# Patient Record
Sex: Male | Born: 1955 | Race: White | Hispanic: No | Marital: Single | Smoking: Former smoker
Health system: Southern US, Community
[De-identification: ages and names within clinical notes are randomized; demographics above are authoritative.]

## PROBLEM LIST (undated history)

## (undated) DIAGNOSIS — M1990 Unspecified osteoarthritis, unspecified site: Secondary | ICD-10-CM

## (undated) DIAGNOSIS — K5903 Drug induced constipation: Secondary | ICD-10-CM

## (undated) DIAGNOSIS — F329 Major depressive disorder, single episode, unspecified: Secondary | ICD-10-CM

## (undated) DIAGNOSIS — I10 Essential (primary) hypertension: Secondary | ICD-10-CM

## (undated) DIAGNOSIS — I2510 Atherosclerotic heart disease of native coronary artery without angina pectoris: Secondary | ICD-10-CM

## (undated) DIAGNOSIS — I219 Acute myocardial infarction, unspecified: Secondary | ICD-10-CM

## (undated) DIAGNOSIS — E782 Mixed hyperlipidemia: Secondary | ICD-10-CM

## (undated) DIAGNOSIS — R32 Unspecified urinary incontinence: Secondary | ICD-10-CM

## (undated) DIAGNOSIS — C801 Malignant (primary) neoplasm, unspecified: Secondary | ICD-10-CM

## (undated) DIAGNOSIS — L719 Rosacea, unspecified: Secondary | ICD-10-CM

## (undated) DIAGNOSIS — F32A Depression, unspecified: Secondary | ICD-10-CM

## (undated) HISTORY — PX: TONSILLECTOMY: SUR1361

## (undated) HISTORY — DX: Unspecified osteoarthritis, unspecified site: M19.90

## (undated) HISTORY — PX: PROSTATE SURGERY: SHX751

## (undated) HISTORY — PX: CORONARY ANGIOPLASTY WITH STENT PLACEMENT: SHX49

## (undated) HISTORY — DX: Mixed hyperlipidemia: E78.2

## (undated) HISTORY — DX: Essential (primary) hypertension: I10

## (undated) HISTORY — DX: Depression, unspecified: F32.A

## (undated) HISTORY — DX: Major depressive disorder, single episode, unspecified: F32.9

## (undated) HISTORY — DX: Acute myocardial infarction, unspecified: I21.9

---

## 1996-09-26 DIAGNOSIS — I219 Acute myocardial infarction, unspecified: Secondary | ICD-10-CM

## 1996-09-26 HISTORY — DX: Acute myocardial infarction, unspecified: I21.9

## 2000-02-03 ENCOUNTER — Ambulatory Visit (HOSPITAL_BASED_OUTPATIENT_CLINIC_OR_DEPARTMENT_OTHER): Admission: RE | Admit: 2000-02-03 | Discharge: 2000-02-03 | Payer: Self-pay | Admitting: Orthopedic Surgery

## 2000-02-03 ENCOUNTER — Encounter (INDEPENDENT_AMBULATORY_CARE_PROVIDER_SITE_OTHER): Payer: Self-pay | Admitting: *Deleted

## 2007-10-24 ENCOUNTER — Encounter: Admission: RE | Admit: 2007-10-24 | Discharge: 2007-10-24 | Payer: Self-pay | Admitting: Family Medicine

## 2008-05-10 ENCOUNTER — Emergency Department (HOSPITAL_COMMUNITY): Admission: EM | Admit: 2008-05-10 | Discharge: 2008-05-10 | Payer: Self-pay | Admitting: *Deleted

## 2008-07-27 HISTORY — PX: BACK SURGERY: SHX140

## 2008-08-06 ENCOUNTER — Encounter (INDEPENDENT_AMBULATORY_CARE_PROVIDER_SITE_OTHER): Payer: Self-pay | Admitting: Orthopedic Surgery

## 2008-08-07 ENCOUNTER — Inpatient Hospital Stay (HOSPITAL_COMMUNITY): Admission: RE | Admit: 2008-08-07 | Discharge: 2008-08-08 | Payer: Self-pay | Admitting: Orthopedic Surgery

## 2008-10-13 ENCOUNTER — Encounter (INDEPENDENT_AMBULATORY_CARE_PROVIDER_SITE_OTHER): Payer: Self-pay | Admitting: Urology

## 2008-10-13 ENCOUNTER — Inpatient Hospital Stay (HOSPITAL_COMMUNITY): Admission: RE | Admit: 2008-10-13 | Discharge: 2008-10-14 | Payer: Self-pay | Admitting: Urology

## 2009-03-26 HISTORY — PX: ELBOW SURGERY: SHX618

## 2009-12-09 ENCOUNTER — Encounter: Admission: RE | Admit: 2009-12-09 | Discharge: 2009-12-09 | Payer: Self-pay | Admitting: Cardiovascular Disease

## 2009-12-11 ENCOUNTER — Inpatient Hospital Stay (HOSPITAL_COMMUNITY): Admission: RE | Admit: 2009-12-11 | Discharge: 2009-12-12 | Payer: Self-pay | Admitting: Cardiovascular Disease

## 2010-12-17 LAB — CBC
HCT: 37.2 % — ABNORMAL LOW (ref 39.0–52.0)
HCT: 38.6 % — ABNORMAL LOW (ref 39.0–52.0)
Hemoglobin: 13.2 g/dL (ref 13.0–17.0)
Hemoglobin: 13.5 g/dL (ref 13.0–17.0)
MCV: 90.2 fL (ref 78.0–100.0)
MCV: 91.2 fL (ref 78.0–100.0)
Platelets: 192 10*3/uL (ref 150–400)
RBC: 4.12 MIL/uL — ABNORMAL LOW (ref 4.22–5.81)
RBC: 4.24 MIL/uL (ref 4.22–5.81)
RDW: 12.5 % (ref 11.5–15.5)
RDW: 12.6 % (ref 11.5–15.5)

## 2010-12-17 LAB — CARDIAC PANEL(CRET KIN+CKTOT+MB+TROPI)
CK, MB: 1 ng/mL (ref 0.3–4.0)
Relative Index: INVALID (ref 0.0–2.5)
Total CK: 95 U/L (ref 7–232)

## 2010-12-17 LAB — BASIC METABOLIC PANEL
BUN: 12 mg/dL (ref 6–23)
CO2: 26 mEq/L (ref 19–32)
Chloride: 104 mEq/L (ref 96–112)

## 2011-01-10 LAB — CBC
MCV: 90.3 fL (ref 78.0–100.0)
RBC: 4.65 MIL/uL (ref 4.22–5.81)
RDW: 11.6 % (ref 11.5–15.5)
WBC: 7.3 10*3/uL (ref 4.0–10.5)

## 2011-01-10 LAB — TYPE AND SCREEN
ABO/RH(D): O POS
Antibody Screen: NEGATIVE

## 2011-01-10 LAB — ABO/RH: ABO/RH(D): O POS

## 2011-01-10 LAB — HEMOGLOBIN AND HEMATOCRIT, BLOOD
HCT: 34.3 % — ABNORMAL LOW (ref 39.0–52.0)
HCT: 38.5 % — ABNORMAL LOW (ref 39.0–52.0)
Hemoglobin: 11.8 g/dL — ABNORMAL LOW (ref 13.0–17.0)

## 2011-01-10 LAB — BASIC METABOLIC PANEL
BUN: 23 mg/dL (ref 6–23)
CO2: 28 mEq/L (ref 19–32)
CO2: 29 mEq/L (ref 19–32)
Chloride: 105 mEq/L (ref 96–112)
Glucose, Bld: 123 mg/dL — ABNORMAL HIGH (ref 70–99)
Potassium: 4.2 mEq/L (ref 3.5–5.1)
Potassium: 4.1 mEq/L (ref 3.5–5.1)
Sodium: 138 mEq/L (ref 135–145)

## 2011-02-08 NOTE — Op Note (Signed)
NAMEDOMINGOS, RIGGI                  ACCOUNT NO.:  [id number]   MEDICAL RECORD NO.:  [id number]          PATIENT TYPE:  INP   LOCATION:  0002                         FACILITY:  [facility]   PHYSICIAN:  Heloise Purpura, MD      DATE OF BIRTH:  06-05-1956   DATE OF PROCEDURE:  10/13/2008  DATE OF DISCHARGE:                               OPERATIVE REPORT   PREOPERATIVE DIAGNOSIS:  Clinically localized adenocarcinoma of prostate  (clinical stage T2a NX MX).   POSTOPERATIVE DIAGNOSIS:  Clinically localized adenocarcinoma of  prostate (clinical stage T2a NX MX).   PROCEDURE:  1. Robotic-assisted laparoscopic radical prostatectomy (bilateral      nerve sparing).  2. Bilateral laparoscopic pelvic lymphadenectomy.   SURGEON:  Dr. Heloise Purpura.   ASSISTANT:  Delia Chimes, nurse practitioner.   ANESTHESIA:  General.   ESTIMATED BLOOD LOSS:  150 mL.   COMPLICATIONS:  None.   SPECIMENS:  1. Prostate and seminal vesicles.  2. Bladder neck margin.  3. Right apical margin.  4. Right pelvic lymph nodes.  5. Left pelvic lymph nodes.   DISPOSITION:  Specimens to pathology.   DRAINS:  1. A 20-French coude catheter.  2. A #19 Blake pelvic drain.   INDICATIONS:  Mr. Square is a 55 year old gentleman with clinically  localized adenocarcinoma of the prostate.  After discussion regarding  management options for treatment, he elected to proceed with surgical  therapy and the above procedures.  The potential risks, complications,  and alternative options associated with the above procedures were  discussed in detail with the patient, and informed consent was obtained.   DESCRIPTION OF PROCEDURE:  The patient was taken the operating room, and  a general anesthetic was administered.  He was given preoperative  antibiotics, placed in the dorsal lithotomy position, prepped and draped  in the usual sterile fashion.  Next, preoperative time-out was  performed.  A site was then selected just to left of  the umbilicus for  placement of the camera port.  This was placed using a standard open  Hassan technique which allowed entry in the peritoneal cavity and under  direct vision.  A Foley catheter had been placed at the beginning of the  case.  A 12-mm port was then placed and a pneumoperitoneum established.  The 0 degrees lens was used to inspect the abdomen, and there was no  evidence for any intra-abdominal injuries or other abnormalities.  The  remaining ports were then placed.  An 8-mm robotic ports were placed in  the right and left lower quadrant respectively with an additional 8-mm  robotic port in the far left lateral abdominal wall.  A 5-mm port was  placed between the camera port and the right robotic port, with a 12-mm  port placed in the far right lateral abdominal wall for laparoscopic  assistance.  All ports were placed under direct vision without  difficulty.  The surgical cart was then docked.  With the aid of the  cautery scissors, the bladder was reflected posteriorly allowing entry  into space of Retzius and identification of the  endopelvic fascia and  prostate.  The endopelvic fascia was incised from the apex back to the  base of the prostate bilaterally, and the underlying levator muscle  fibers were swept laterally off the prostate, thereby isolating the  dorsal venous complex.  The dorsal venous complex was then stapled and  divided with a 45-mm flex ETS stapler.  The bladder neck was then  identified with the aid of Foley catheter manipulation and was divided  anteriorly, thereby exposing the Foley catheter.  Foley catheter was  then brought into the operative field and used to retract the prostate  anteriorly.  There was noted to be a small median lobe which was  enucleated and excised from the bladder neck.  Dissection then proceeded  posteriorly between the bladder neck and prostate until the vasa  deferentia and seminal vesicles were identified.  There was noted  to be  a little bit of abnormal tissue on the left side of the bladder neck  which was excised and sent as a separate bladder neck margin.  The vasa  deferentia were then isolated, divided and lifted anteriorly.  The  seminal vesicles were then dissected down to their tips with care to  control the seminal vesicle arterial blood supply.  They were then  lifted anteriorly, and the space between Denonvilliers fascia and the  anterior rectum was bluntly developed, thereby isolating the vascular  pedicles of prostate.  The lateral prostatic fascia was then incised  bilaterally, thereby releasing the neurovascular bundles.  The vascular  pedicles of the prostate was then ligated with Hem-o-lok clips above the  level of the neurovascular bundles and divided with sharp cold scissor  dissection.  The neurovascular bundles were then swept off the apex of  the prostate and the urethra.  The urethra was then sharply divided  allowing the prostate specimen to be disarticulated.  There was noted to  be a small area of possible prostate apical tissue off the right side of  the urethra which was excised and sent as a separate margin.  The pelvis  was then copiously irrigated, and hemostasis was ensured.  There was no  evidence for a rectal injury.  Attention then turned to the right pelvic  sidewall.  The fibrofatty tissue between the external iliac vein,  confluence of the iliac vessels, hypogastric artery, and Cooper's  ligament was dissected free from the pelvic sidewall with care to  preserve the obturator nerve.  Hem-o-lok clips were used for  lymphostasis and hemostasis.  An identical procedure was then performed  on the contralateral side.  Attention then turned to the urethral  anastomosis.  A 2-0 Vicryl slip-knot was placed between Denonvilliers  fascia, the posterior bladder neck, and the posterior urethra to  reapproximate these structures.  There was noted be a small arterial  bleeder toward  the distal aspect of the left neurovascular bundle.  This  was controlled with a figure-of-eight 3-0 Vicryl suture.  Attention then  returned to the urethral anastomosis, and a double-armed 3-0 Monocryl  suture was used to perform a 360-running tension-free anastomosis.  A  new 20-French coude catheter was inserted into the bladder and  irrigated.  There were no blood clots within the bladder, and the  anastomosis appeared to be watertight.  A #20 Blake drain was then  brought through the left robotic port and appropriately positioned in  the pelvis.  It was secured to the skin with a nylon suture.  The  surgical cart  was then undocked.  The right lateral 12-mm port site was  closed with a 0 Vicryl suture placed with the aid of the suture passer  device.  All remaining ports were removed under direct vision.  The  prostate specimen was then removed intact within the Endopouch retrieval  bag via the periumbilical port site.  This fascial opening was closed  with a running 0 Vicryl suture.  All port sites were then injected with  0.25%  Marcaine and reapproximated at the skin level with staples.  Sterile  dressings were applied.  The patient appeared to tolerate the procedure  well and without complications.  He was able to be extubated and  transferred to recovery unit in satisfactory condition.      Heloise Purpura, MD  Electronically Signed     LB/MEDQ  D:  10/13/2008  T:  10/13/2008  Job:  [id number]

## 2011-02-11 NOTE — Op Note (Signed)
[place]  Patient:    Sean Fritz, Sean Fritz                         MRN: [id number] Proc. Date: 02/03/00 Adm. 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  PROCEDURE:  Excisional biopsy with removal of exostosis and soft tissue mass.  SURGEON:  Nadara Mustard, M.D.  ANESTHESIA:  Ankle block.  ESTIMATED BLOOD LOSS:  minimal.  ANTIBIOTICS:  One g of Kefzol.  TOURNIQUET TIME:  12 minutes at 250 mmHg.  DISPOSITION:  To PACU in stable condition.  INDICATIONS:  Patient is a 55 year old gentleman with a large painful mass of the medial malleolus of his left ankle.  Patient has undergone conservative care, was unable to perform activities of daily living due to pain from the mass.  Patient has previously undergone a MRI scan, which was negative for malignancy and presents at this time for surgical debridement and removal. The risks and benefits were discussed, including infection, neurovascular injury, dystrophy type pain, recurrence of the mass, potential of malignancy. Patient states he understands and wished to proceed at this time.  DESCRIPTION OF PROCEDURE:  Patient was brought to OR room 5 after undergoing an ankle block.  After adequate level of anesthesia obtained, patients left lower extremity was prepped using Duraprep and draped into a sterile field and Ioban was used to cover all exposed skin.  A longitudinal curvilinear incision was made directly over the medial malleolus just posterior to the mass.  This was carried sharply down to bone.  Subperiosteal dissection was performed and the bony and soft tissue mass was excised.  The wound was irrigated with normal saline.  The tourniquet was deflated after 12 minutes.  Hemostasis  was obtained.  The deep subcu was closed using 3-0 Vicryl, subcu  was closed using a running 3-0 Monocryl.  The wound was covered with Steri-Strips, Adaptic, orthopedic sponges, sterile Webril and a loosely wrapped Coban.  The patient was then taken to PACU in stable condition.  Follow up in the office in two weeks.  Weight bearing as tolerated. DD:  02/03/00 TD:  02/04/00 Job: [id number]

## 2011-03-01 ENCOUNTER — Encounter: Payer: Medicare Other | Attending: Physical Medicine & Rehabilitation

## 2011-03-01 ENCOUNTER — Ambulatory Visit (HOSPITAL_BASED_OUTPATIENT_CLINIC_OR_DEPARTMENT_OTHER): Payer: Self-pay | Admitting: Physical Medicine & Rehabilitation

## 2011-03-01 DIAGNOSIS — Z8546 Personal history of malignant neoplasm of prostate: Secondary | ICD-10-CM | POA: Insufficient documentation

## 2011-03-01 DIAGNOSIS — I2510 Atherosclerotic heart disease of native coronary artery without angina pectoris: Secondary | ICD-10-CM | POA: Insufficient documentation

## 2011-03-01 DIAGNOSIS — G8928 Other chronic postprocedural pain: Secondary | ICD-10-CM | POA: Insufficient documentation

## 2011-03-01 DIAGNOSIS — Z9861 Coronary angioplasty status: Secondary | ICD-10-CM | POA: Insufficient documentation

## 2011-03-01 DIAGNOSIS — M961 Postlaminectomy syndrome, not elsewhere classified: Secondary | ICD-10-CM

## 2011-03-01 DIAGNOSIS — Z79899 Other long term (current) drug therapy: Secondary | ICD-10-CM | POA: Insufficient documentation

## 2011-03-01 DIAGNOSIS — IMO0002 Reserved for concepts with insufficient information to code with codable children: Secondary | ICD-10-CM

## 2011-03-02 NOTE — Group Therapy Note (Signed)
A 55 year old male who had a work-related injury May 10, 2008.  He was treated by Dr. Danne Harbor over at [facility], had epidural injections which were not particularly helpful, had a second opinion from Dr. Shelle Iron, was also seen by Dr. Simonne Come who performed L4- 5 diskectomy.  Seen by Dr. Shelle Iron who did a second opinion, no further surgical intervention was planned.  He was maintained on hydrocodone 5/325 t.i.d. for number of years and over the last year q.i.d. in combination with Neurontin 300 mg t.i.d.  The patient states that he has had some re-injuries after his original injury, but none of these were at work since he has been out of work since May 10, 2008.  He has been placed on disability Feb 18, 2011. He is no longer on the workers' comp and is now on a different insurance plan not covered by his [employer].  REVIEW OF SYSTEMS:  Positive for bladder control problems, numbness, tingling, trouble walking, constipation, and weight gain.  His pain is improved with rest, heat, medications; worsens with walking, sitting, standing.  His primary care physician is Dr. Tracey Harries; Dr. Henrine Screws; urologist, Dr. Heloise Purpura; cardiologist, Dr. Nicki Guadalajara.  OTHER PAST HISTORY:  Significant for prostate carcinoma diagnosed February 2010.  He has had prostate resection.  He underwent robotic- assisted laparoscopic radical prostatectomy in October 13, 2008.  In addition, he underwent cardiac catheterization on December 12, 2010, per Dr. Nicki Guadalajara revealing normal left ventricular function, some LAD narrowing, PCI diagonal vessel, PTCA stenting.  SOCIAL HISTORY:  Single, lives alone.  FAMILY HISTORY:  Heart disease, diabetes, hypertension.  PHYSICAL EXAMINATION:  VITAL SIGNS:  Blood pressure 150/78, pulse 77, respirations 16, O2 sat 98% room air. GENERAL:  Well-developed, well-nourished, overweight male in no acute stress.  Orientation x3.   Affect alert.  Gait is normal. NEUROLOGIC:  His back has a healed midline incision in lumbosacral area. He has no hypersensitivity to touch over that area.  He does have some tenderness to palpation in the lumbar paraspinal muscles at those levels.  He has negative straight leg raising, normal lower extremity strength, range of motion, and deep tendon reflexes as well as sensation.  He has limited range of motion in the lumbar spine of approximately 50% forward flexion, 25% extension, lateral rotation, and bending.  Gait shows no evidence of toe drag or knee instability.  IMPRESSION: 1. Lumbar postlaminectomy syndrome, chronic postoperative pain, no     evidence of radiculopathy. 2. History of coronary artery disease. 3. Prostate carcinoma status post radical prostatectomy.  RECOMMENDATION:  He is maintained at a independent level, but not working on the current medication regimen which consists of hydrocodone 5/325 q.i.d. in conjunction with gabapentin 300 t.i.d.  I think it is reasonable to continue this combination of medications.  He will maintain contact with his primary care physician as well who has him on bupropion.  He is on multiple medications for his coronary artery disease by Dr. Nicki Guadalajara.  We will check urine drug screen.  If this checks out, we can assume his hydrocodone prescription, he may need an additional 2 weeks as he is running low on his current supply to allow [place] to get the results of the urine drug screen back.  He will see my midlevel provider in 1 month and then see me back in 6 months.  He can be seen basically on a every 3 months basis given that he is on a stable  regimen of medications.     Erick Colace, M.D. Electronically Signed    AEK/MedQ D:  03/01/2011 13:52:54  T:  03/02/2011 01:20:29  Job #:  [id number]  cc:   Caralyn Guile. Ethelene Hal, M.D. Fax: [phone]  Nicki Guadalajara, M.D. Fax: [phone]

## 2011-03-29 ENCOUNTER — Encounter: Payer: Medicare Other | Attending: Neurosurgery | Admitting: Neurosurgery

## 2011-03-29 DIAGNOSIS — M545 Low back pain, unspecified: Secondary | ICD-10-CM | POA: Insufficient documentation

## 2011-03-29 DIAGNOSIS — G8929 Other chronic pain: Secondary | ICD-10-CM | POA: Insufficient documentation

## 2011-03-29 DIAGNOSIS — Z8546 Personal history of malignant neoplasm of prostate: Secondary | ICD-10-CM | POA: Insufficient documentation

## 2011-03-29 DIAGNOSIS — M961 Postlaminectomy syndrome, not elsewhere classified: Secondary | ICD-10-CM | POA: Insufficient documentation

## 2011-03-30 NOTE — Assessment & Plan Note (Signed)
HISTORY OF PRESENT ILLNESS:  This is a gentleman seen by Dr. Wynn Banker in evaluation for his low back pain.  He had been treated at [facility] by Dr. Ethelene Hal, Dr. Shelle Iron, and Dr. Leslee Home.  He has post- laminectomy syndrome.  He rates his pain today at 5-6 and is unchanged. General activity level 6-7.  His pain is worse over the same 24 hours a day.  Sleep patterns are poor.  All activities aggravate.  Rest, heat, and medication tend to help.  MOBILITY:  Walks without assistance.  He walks up to 20 minutes at a time.  He climb steps and drives.  REVIEW OF SYSTEMS:  Notable for those difficulties as described above as well as some constipation, numbness, paresthesia like feelings, trouble with ambulation, and spasm.  No depression or suicidal thoughts.  No signs of aberrant behaviors.  Since his last visit, his medical history is unchanged.  SOCIAL HISTORY:  He is single, lives alone.  FAMILY HISTORY:  Unchanged.  PHYSICAL EXAMINATION:  VITAL SIGNS:  Blood pressure 126/66, pulse 58, respirations 20, and O2 sats 68 on room air. NEUROLOGIC:  Motor strength is 5/5 in lower extremities.  Sensation is intact.  Constitutionally, he is within normal limits.  He is alert and oriented x3.  He does not have an altered gait.  IMPRESSION: 1. Post-laminectomy syndrome. 2. Chronic pain. 3. History of prostate carcinoma.  PLAN:  He just refilled his hydrocodone 5/325 q.i.d.  He has taken his gabapentin 300 t.i.d.  I will see him back in 3 months.  We will keep his medicines refilled until that time.  His questions were encouraged and answered.     Leilene Diprima L. Blima Dessert Electronically Signed    RLW/MedQ D:  03/29/2011 10:53:19  T:  03/30/2011 02:01:05  Job #:  [id number]

## 2011-06-28 LAB — BASIC METABOLIC PANEL
CO2: 29
Creatinine, Ser: 1.1
Glucose, Bld: 114 — ABNORMAL HIGH
Potassium: 5.2 — ABNORMAL HIGH
Sodium: 142

## 2011-06-28 LAB — HEMOGLOBIN AND HEMATOCRIT, BLOOD: Hemoglobin: 14.7

## 2011-06-29 ENCOUNTER — Encounter: Payer: Medicare Other | Attending: Neurosurgery | Admitting: Neurosurgery

## 2011-06-29 DIAGNOSIS — M961 Postlaminectomy syndrome, not elsewhere classified: Secondary | ICD-10-CM | POA: Insufficient documentation

## 2011-06-29 DIAGNOSIS — G894 Chronic pain syndrome: Secondary | ICD-10-CM

## 2011-06-29 DIAGNOSIS — M545 Low back pain, unspecified: Secondary | ICD-10-CM | POA: Insufficient documentation

## 2011-06-29 DIAGNOSIS — G8929 Other chronic pain: Secondary | ICD-10-CM | POA: Insufficient documentation

## 2011-06-29 DIAGNOSIS — Z8546 Personal history of malignant neoplasm of prostate: Secondary | ICD-10-CM | POA: Insufficient documentation

## 2011-06-29 NOTE — Assessment & Plan Note (Signed)
This patient is a patient of Dr. Wynn Banker who was seen in the clinic every 3 months for low back pain.  He reports no changes to his pain. His average pain is 4-6 which is sharp, burning, stabbing, tingling and aching pain.  General activity level is 6-7.  Pain is worst in the evening.  Sleep patterns are poor.  All activities aggravate at rest, heat and medication tend to help.  He walks without assistance.  He can walk up to 40 minutes at a time and climb steps and drives.  He has been on disability since February.  REVIEW OF SYSTEMS:  Notable for the difficulties described above as well as some weight loss or gain, constipation, tingling, numbness, spasms, no suicidal thoughts or aberrant behaviors.  Pill counts correct.  Last UDS was consistent.  PAST MEDICAL HISTORY, SOCIAL HISTORY AND FAMILY HISTORY:  Unchanged.  PHYSICAL EXAMINATION:  VITAL SIGNS:  His blood pressure is 155/77, pulse 75, respirations 16 and O2 sats 98 on room air. EXTREMITIES:  His motor strength and sensation are intact in the upper and lower extremities. NEUROLOGIC:  Constitutionally, he is within normal limits.  He is alert and oriented x3.  He appears somewhat depressed today.  He states his mother has been diagnosed with an operable breast cancer.  He is going to [place] to see her.  IMPRESSION: 1. Post laminectomy syndrome. 2. Chronic pain. 3. History of prostate cancer.  PLAN:  I refilled hydrocodone 5/500 one p.o. q.i.d. 120 with no refill. His questions were encouraged and answered.  We will see him back in the clinic in 17-month.     Yulitza Shorts L. Blima Dessert Electronically Signed    RLW/MedQ D:  06/29/2011 10:31:37  T:  06/29/2011 13:13:04  Job #:  [id number]

## 2011-09-28 ENCOUNTER — Encounter: Payer: Medicare Other | Attending: Neurosurgery | Admitting: Neurosurgery

## 2011-09-28 DIAGNOSIS — M961 Postlaminectomy syndrome, not elsewhere classified: Secondary | ICD-10-CM | POA: Insufficient documentation

## 2011-09-28 DIAGNOSIS — M545 Low back pain, unspecified: Secondary | ICD-10-CM | POA: Insufficient documentation

## 2011-09-28 DIAGNOSIS — G8929 Other chronic pain: Secondary | ICD-10-CM | POA: Insufficient documentation

## 2011-09-28 DIAGNOSIS — G894 Chronic pain syndrome: Secondary | ICD-10-CM

## 2011-09-29 NOTE — Assessment & Plan Note (Signed)
This is a patient of Dr. Wynn Banker was seen for low back pain on 3 months  rotation.  The patient's average pain is 4-6, which is sharp, burning, dull, stabbing, tingling pain.  General activity level is 5-6. Pain is worse in the evening.  Sleep patterns are poor.  All activities aggravate.  Rest, heat, medication tend to help.  He walks without assistance.  He can walk about 40 minutes at a time.  He climb steps and drives.  Functionally, he is on disability.  REVIEW OF SYSTEMS:  Notable for difficulties described above as well as some paresthesias, spasms, trouble walking, constipation, weight gain. No aberrant behaviors.  Pill counts, UDS consistent.  PAST MEDICAL HISTORY, SOCIAL HISTORY, FAMILY HISTORY:  Unchanged.  PHYSICAL EXAMINATION:  His blood pressure is 161/69, pulse 82, respirations 16, O2 sats 98 on room air.  His motor strength, sensation intact.  Constitutionally, he is within normal limits.  He is alert and oriented x3.  Has normal gait.  ASSESSMENT: 1. Postlaminectomy syndrome. 2. Chronic pain. 3. History of prostate cancer.  PLAN:  Refill hydrocodone 5/325 1 p.o. q.i.d. 120 with no refills.  He will see Dr. Wynn Banker back in clinic in 3 months.     Mahaila Tischer L. Blima Dessert Electronically Signed    RLW/MedQ D:  09/28/2011 11:53:57  T:  09/29/2011 01:11:27  Job #:  [id number]

## 2011-10-21 ENCOUNTER — Ambulatory Visit: Payer: Medicare Other | Admitting: Physical Medicine & Rehabilitation

## 2011-10-21 ENCOUNTER — Ambulatory Visit (HOSPITAL_BASED_OUTPATIENT_CLINIC_OR_DEPARTMENT_OTHER): Payer: Medicare Other | Admitting: Physical Medicine & Rehabilitation

## 2011-10-21 DIAGNOSIS — M961 Postlaminectomy syndrome, not elsewhere classified: Secondary | ICD-10-CM

## 2011-10-21 DIAGNOSIS — G894 Chronic pain syndrome: Secondary | ICD-10-CM

## 2011-10-22 NOTE — Assessment & Plan Note (Signed)
This is a patient of Dr. Wynn Banker, is seen for low back pain.  In 3 months rotation, the patient had worsening change in his pain, and it is 4/6, anywhere from sharp to an aching pain.  General activity level is 5-6.  The pain is worsening and sleep pattern is poor. He did not indicate what worsens or improves pain  __________ he can climb steps and drives.  Functionally, he is on disability.  REVIEW OF SYSTEMS:  Notable for difficulty as described above as well as bladder control issues, numbness, tingling, trouble walking, spasms, weight gain, constipation.  No suicidal thoughts or aberrant behaviors. Last pill count on UDS consistent.  Past medical history, social history, and family history are unchanged.  PHYSICAL EXAMINATION:  VITAL SIGNS:  Blood pressure is 154/73, pulse 78, respirations 16, and O2 sats 97% on room air.  His motor strength and sensation intact.  Constitutionally, he is within normal limits.  He is alert and oriented x3.  ASSESSMENT: 1. Postlaminectomy syndrome, lumbar spine. 2. Chronic pain.  PLAN:  Refill hydrocodone 5/325 one p.o. q.i.d., 120, with no refill as needed, he just got it filled.  He will follow up with Dr. Wynn Banker in 3 months.     Sean Fritz L. Blima Dessert Electronically Signed    RLW/MedQ D:  10/21/2011 11:23:30  T:  10/21/2011 21:09:13  Job #:  [id number]

## 2011-10-28 ENCOUNTER — Ambulatory Visit: Payer: Medicare Other | Admitting: Physical Medicine & Rehabilitation

## 2011-12-11 ENCOUNTER — Other Ambulatory Visit: Payer: Self-pay | Admitting: Physical Medicine & Rehabilitation

## 2012-01-13 ENCOUNTER — Ambulatory Visit (HOSPITAL_BASED_OUTPATIENT_CLINIC_OR_DEPARTMENT_OTHER): Payer: Medicare Other | Admitting: Physical Medicine & Rehabilitation

## 2012-01-13 ENCOUNTER — Encounter: Payer: Medicare Other | Attending: Neurosurgery

## 2012-01-13 ENCOUNTER — Encounter: Payer: Self-pay | Admitting: Physical Medicine & Rehabilitation

## 2012-01-13 DIAGNOSIS — M545 Low back pain, unspecified: Secondary | ICD-10-CM | POA: Insufficient documentation

## 2012-01-13 DIAGNOSIS — M961 Postlaminectomy syndrome, not elsewhere classified: Secondary | ICD-10-CM | POA: Insufficient documentation

## 2012-01-13 DIAGNOSIS — G8929 Other chronic pain: Secondary | ICD-10-CM | POA: Insufficient documentation

## 2012-01-13 DIAGNOSIS — G894 Chronic pain syndrome: Secondary | ICD-10-CM

## 2012-01-13 MED ORDER — HYDROCODONE-ACETAMINOPHEN 5-500 MG PO TABS: 1.0000 | ORAL_TABLET | Freq: Four times a day (QID) | ORAL | Status: DC | PRN

## 2012-01-13 NOTE — Progress Notes (Signed)
Subjective:    Patient ID: Sean Fritz, male    DOB: November 28, 1955, 56 y.o.   MRN: [id number]  HPI A 56 year old male who had a work-related injury May 10, 2008. He  was treated by Dr. Danne Harbor over at [facility], had  epidural injections which were not particularly helpful, had a second  opinion from Dr. Shelle Iron, was also seen by Dr. Simonne Come who performed L4-  5 diskectomy. Seen by Dr. Shelle Iron who did a second opinion, no further  surgical intervention was planned. He was maintained on hydrocodone  5/325 t.i.d. for number of years and over the last year q.i.d. in  combination with Neurontin 300 mg t.i.d.  The patient states that he has had some re-injuries after his original  injury, but none of these were at work since he has been out of work  since May 10, 2008. He has been placed on disability Feb 18, 2011.  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 General activity 7 Relation with others 7 Enjoyment of life 7 What TIME of day is your pain at its worst? evening Sleep (in general) Poor  Pain is worse with: walking, bending, sitting, inactivity and standing Pain improves with: rest, heat/ice and medication Relief from Meds: 5  Mobility walk without assistance how many minutes can you walk? 40-50 ability to climb steps?  yes do you drive?  yes  Function disabled: date disabled 2012  Neuro/Psych bladder control problems numbness tingling spasms  Prior Studies Any changes since last visit?  no  Physicians involved in your care Any changes since last visit?  no  Review of Systems  Constitutional: Positive for unexpected weight change.  HENT: Negative.   Eyes: Negative.   Respiratory: Negative.     Cardiovascular: Negative.   Gastrointestinal: Positive for constipation.  Genitourinary: Negative.   Musculoskeletal: Negative.   Skin: Negative.   Neurological: Negative.   Hematological: Negative.   Psychiatric/Behavioral: Negative.        Objective:   Physical Exam  Constitutional: He is oriented to person, place, and time. He appears well-developed and well-nourished.  Musculoskeletal:       Lumbar back: He exhibits decreased range of motion.  Neurological: He is alert and oriented to person, place, and time. He has normal strength.  Reflex Scores:      Tricep reflexes are 2+ on the right side and 2+ on the left side.      Bicep reflexes are 2+ on the right side and 2+ on the left side.      Brachioradialis reflexes are 2+ on the right side and 2+ on the left side.      Patellar reflexes are 2+ on the right side and 2+ on the left side.      Achilles reflexes are 2+ on the right side and 2+ on the left side. Psychiatric: He has a normal mood and affect.       Assessment & Plan:  1.  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  Pt. On disability.  Works out on treadmill and Exercise bike.  Cont hydrocodone rec restart gabapentin.  Offered Pain psych pt declines.PA visit in 3 mo 2.  Narcotic dependance no signs of abuse  UDS ok,pill counts ok

## 2012-01-13 NOTE — Patient Instructions (Signed)
Back Exercises These exercises may help you when beginning to rehabilitate your injury. Your symptoms may resolve with or without further involvement from your physician, physical therapist or athletic trainer. While completing these exercises, remember:   Restoring tissue flexibility helps normal motion to return to the joints. This allows healthier, less painful movement and activity.   An effective stretch should be held for at least 30 seconds.   A stretch should never be painful. You should only feel a gentle lengthening or release in the stretched tissue.  STRETCH - Extension, Prone on Elbows   Lie on your stomach on the floor, a bed will be too soft. Place your palms about shoulder width apart and at the height of your head.   Place your elbows under your shoulders. If this is too painful, stack pillows under your chest.   Allow your body to relax so that your hips drop lower and make contact more completely with the floor.   Hold this position for __________ seconds.   Slowly return to lying flat on the floor.  Repeat __________ times. Complete this exercise __________ times per day.  RANGE OF MOTION - Extension, Prone Press Ups   Lie on your stomach on the floor, a bed will be too soft. Place your palms about shoulder width apart and at the height of your head.   Keeping your back as relaxed as possible, slowly straighten your elbows while keeping your hips on the floor. You may adjust the placement of your hands to maximize your comfort. As you gain motion, your hands will come more underneath your shoulders.   Hold this position __________ seconds.   Slowly return to lying flat on the floor.  Repeat __________ times. Complete this exercise __________ times per day.  RANGE OF MOTION- Quadruped, Neutral Spine   Assume a hands and knees position on a firm surface. Keep your hands under your shoulders and your knees under your hips. You may place padding under your knees for  comfort.   Drop your head and point your tail bone toward the ground below you. This will round out your low back like an angry cat. Hold this position for __________ seconds.   Slowly lift your head and release your tail bone so that your back sags into a large arch, like an old horse.   Hold this position for __________ seconds.   Repeat this until you feel limber in your low back.   Now, find your "sweet spot." This will be the most comfortable position somewhere between the two previous positions. This is your neutral spine. Once you have found this position, tense your stomach muscles to support your low back.   Hold this position for __________ seconds.  Repeat __________ times. Complete this exercise __________ times per day.  STRETCH - Flexion, Single Knee to Chest   Lie on a firm bed or floor with both legs extended in front of you.   Keeping one leg in contact with the floor, bring your opposite knee to your chest. Hold your leg in place by either grabbing behind your thigh or at your knee.   Pull until you feel a gentle stretch in your low back. Hold __________ seconds.   Slowly release your grasp and repeat the exercise with the opposite side.  Repeat __________ times. Complete this exercise __________ times per day.  STRETCH - Hamstrings, Standing  Stand or sit and extend your right / left leg, placing your foot on a chair   or foot stool   Keeping a slight arch in your low back and your hips straight forward.   Lead with your chest and lean forward at the waist until you feel a gentle stretch in the back of your right / left knee or thigh. (When done correctly, this exercise requires leaning only a small distance.)   Hold this position for __________ seconds.  Repeat __________ times. Complete this stretch __________ times per day. STRENGTHENING - Deep Abdominals, Pelvic Tilt   Lie on a firm bed or floor. Keeping your legs in front of you, bend your knees so they are  both pointed toward the ceiling and your feet are flat on the floor.   Tense your lower abdominal muscles to press your low back into the floor. This motion will rotate your pelvis so that your tail bone is scooping upwards rather than pointing at your feet or into the floor.   With a gentle tension and even breathing, hold this position for __________ seconds.  Repeat __________ times. Complete this exercise __________ times per day.  STRENGTHENING - Abdominals, Crunches   Lie on a firm bed or floor. Keeping your legs in front of you, bend your knees so they are both pointed toward the ceiling and your feet are flat on the floor. Cross your arms over your chest.   Slightly tip your chin down without bending your neck.   Tense your abdominals and slowly lift your trunk high enough to just clear your shoulder blades. Lifting higher can put excessive stress on the low back and does not further strengthen your abdominal muscles.   Control your return to the starting position.  Repeat __________ times. Complete this exercise __________ times per day.  STRENGTHENING - Quadruped, Opposite UE/LE Lift   Assume a hands and knees position on a firm surface. Keep your hands under your shoulders and your knees under your hips. You may place padding under your knees for comfort.   Find your neutral spine and gently tense your abdominal muscles so that you can maintain this position. Your shoulders and hips should form a rectangle that is parallel with the floor and is not twisted.   Keeping your trunk steady, lift your right hand no higher than your shoulder and then your left leg no higher than your hip. Make sure you are not holding your breath. Hold this position __________ seconds.   Continuing to keep your abdominal muscles tense and your back steady, slowly return to your starting position. Repeat with the opposite arm and leg.  Repeat __________ times. Complete this exercise __________ times per  day. Document Released: 09/30/2005 Document Revised: 09/01/2011 Document Reviewed: 12/25/2008 Exit[employer] Patient Information 2012 [employer]. 

## 2012-01-17 ENCOUNTER — Ambulatory Visit: Payer: Medicare Other | Admitting: Physical Medicine & Rehabilitation

## 2012-02-16 ENCOUNTER — Other Ambulatory Visit: Payer: Self-pay | Admitting: Physical Medicine & Rehabilitation

## 2012-03-22 ENCOUNTER — Other Ambulatory Visit: Payer: Self-pay | Admitting: Physical Medicine & Rehabilitation

## 2012-04-13 ENCOUNTER — Encounter: Payer: Medicare Other | Attending: Physical Medicine & Rehabilitation | Admitting: Physical Medicine and Rehabilitation

## 2012-04-13 ENCOUNTER — Encounter: Payer: Self-pay | Admitting: Physical Medicine and Rehabilitation

## 2012-04-13 VITALS — BP 151/78 | HR 75 | Resp 14 | Ht 68.0 in | Wt 212.0 lb

## 2012-04-13 DIAGNOSIS — G8929 Other chronic pain: Secondary | ICD-10-CM | POA: Insufficient documentation

## 2012-04-13 DIAGNOSIS — M549 Dorsalgia, unspecified: Secondary | ICD-10-CM

## 2012-04-13 DIAGNOSIS — M961 Postlaminectomy syndrome, not elsewhere classified: Secondary | ICD-10-CM

## 2012-04-13 NOTE — Patient Instructions (Addendum)
Continue with exercises program. Advised patient to look into water exercises.

## 2012-04-13 NOTE — Progress Notes (Signed)
Subjective:    Patient ID: Sean Fritz, male    DOB: 05/11/1956, 56 y.o.   MRN: [id number]  HPI A 56 year old male who had a work-related injury May 10, 2008. He  was treated by Dr. Danne Harbor over at [facility], had  epidural injections which were not particularly helpful, had a second  opinion from Dr. Shelle Iron, was also seen by Dr. Simonne Come who performed L4-  5 diskectomy. Seen by Dr. Shelle Iron who did a second opinion, no further  surgical intervention was planned. He was maintained on hydrocodone  5/325 t.i.d. for number of years and over the last year q.i.d. in  combination with Neurontin 300 mg t.i.d.  The patient states that he has had some re-injuries after his original  injury, but none of these were at work since he has been out of work  since May 10, 2008. He has been placed on disability Feb 18, 2011.  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 The patient also complains about intermittend diffuse numbness and tingling in his LE. The problem has been stable.  Pain Inventory Average Pain 6 Pain Right Now 4 My pain is constant, sharp, burning, stabbing, tingling and aching  In the last 24 hours, has pain interfered with the following? General activity 7 Relation with others 7 Enjoyment of life 7 What TIME of day is your pain at its worst? evening Sleep (in general) Poor  Pain is worse with: walking, bending, sitting and standing Pain improves with: rest, heat/ice and medication Relief from Meds: 5  Mobility walk without assistance how many minutes can you walk? 30-40 ability to climb steps?  yes do you drive?  yes Do you have any goals in this area?  yes  Function disabled: date disabled 2012 Do you have any goals in this area?   yes  Neuro/Psych bladder control problems numbness tingling spasms  Prior Studies Any changes since last visit?  no  Physicians involved in your care Any changes since last visit?  no   Family History  Problem Relation Age of Onset  . Cancer Mother   . Lupus Sister    History   Social History  . Marital Status: Single    Spouse Name: N/A    Number of Children: N/A  . Years of Education: N/A   Social History Main Topics  . Smoking status: Former Games developer  . Smokeless tobacco: None  . Alcohol Use: None  . Drug Use: None  . Sexually Active: None   Other Topics Concern  . None   Social History Narrative  . None   History reviewed. No pertinent past surgical history. Past Medical History  Diagnosis Date  . Hypertension   . Depression    Pulse 75  Resp 14  Ht 5\' 8"  (1.727 m)  Wt 212 lb (96.163 kg)  BMI 32.23 kg/m2  SpO2 97%     Review of Systems  Constitutional: Positive for unexpected weight change.  Gastrointestinal: Positive for constipation.  Musculoskeletal: Positive for back pain.  Neurological: Positive for numbness.  All other systems reviewed and are negative.       Objective:   Physical Exam  Constitutional: He is oriented to person, place, and time. He appears well-developed and well-nourished.  HENT:  Head: Normocephalic.  Neck: Neck supple.  Musculoskeletal: He exhibits tenderness.  Neurological: He is alert and oriented to person, place, and time.  Skin: Skin is warm and dry.  Psychiatric: He has a normal mood and affect.    Symmetric normal motor tone is noted throughout. Normal muscle bulk. Muscle testing reveals 5/5 muscle strength of the upper extremity, and 5/5 of the lower extremity. Full range of motion in upper and lower extremities. ROM of spine is restricted. Fine motor movements are normal in both hands. Sensory is intact and symmetric to light touch, pinprick and proprioception. DTR in the upper and lower extremity are  present and symmetric 2+. No clonus is noted.  Patient arises from chair with mild difficulty. Narrow based gait with normal arm swing bilateral , able to walk on heels and toes . Tandem walk is stable. No pronator drift. Rhomberg negative. Good finger to nose and heel to shin testing. No tremor, dystaxia or dysmetria noted.       Assessment & Plan:  1. 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 Pt. On disability. Works out on treadmill and Exercise bike. Cont hydrocodone rec restart gabapentin. Advised patient to continue with his exercise program, went through his exercises and determined which one he should continue to do and which one he should not do. Also advised the patient to look into exercising in the water. PA visit in 3 mo  2. No signs of abuse UDS ok,pill counts ok

## 2012-04-25 ENCOUNTER — Other Ambulatory Visit: Payer: Self-pay | Admitting: Physical Medicine & Rehabilitation

## 2012-04-25 ENCOUNTER — Telehealth: Payer: Self-pay | Admitting: Physical Medicine & Rehabilitation

## 2012-04-25 NOTE — Telephone Encounter (Signed)
Patient came in with questionable pain in side.  Xray negative.  Suspects muscle spasms.  Patient states methocarbamol not working.  Can we give something else?

## 2012-04-26 ENCOUNTER — Telehealth: Payer: Self-pay | Admitting: Physical Medicine & Rehabilitation

## 2012-04-26 NOTE — Telephone Encounter (Signed)
How much of the robaxin is she taking, she can take 4x500mg /day, if it was the left side she should check her heart.

## 2012-04-26 NOTE — Telephone Encounter (Signed)
Following up on call from his PCP, [facility].

## 2012-04-26 NOTE — Telephone Encounter (Signed)
Please route this to Clydie Braun

## 2012-04-27 ENCOUNTER — Telehealth: Payer: Self-pay

## 2012-04-27 ENCOUNTER — Encounter: Payer: Medicare Other | Admitting: Physical Medicine and Rehabilitation

## 2012-04-27 NOTE — Telephone Encounter (Signed)
How about flexeril, why is he asking for tizanidine, did he used that before, with good results ?

## 2012-04-27 NOTE — Telephone Encounter (Signed)
Pt would like to try tizanedine?  Is this ok.  Robaxin does not work.

## 2012-04-27 NOTE — Telephone Encounter (Signed)
Spoke with patient.  Lots of confusion as to what he needs.  He will schedule appt to discuss options.

## 2012-04-30 NOTE — Telephone Encounter (Signed)
Pt is going to see PCP Wednesday and will discuss options since he cannot afford to come to our office for a month.

## 2012-04-30 NOTE — Telephone Encounter (Signed)
ok 

## 2012-05-28 ENCOUNTER — Other Ambulatory Visit: Payer: Self-pay | Admitting: Physical Medicine & Rehabilitation

## 2012-07-02 ENCOUNTER — Other Ambulatory Visit: Payer: Self-pay | Admitting: Physical Medicine & Rehabilitation

## 2012-07-11 ENCOUNTER — Encounter: Payer: Self-pay | Admitting: Physical Medicine and Rehabilitation

## 2012-07-11 ENCOUNTER — Encounter
Payer: Medicare Other | Attending: Physical Medicine and Rehabilitation | Admitting: Physical Medicine and Rehabilitation

## 2012-07-11 VITALS — BP 164/58 | HR 87 | Resp 16 | Ht 68.0 in | Wt 215.0 lb

## 2012-07-11 DIAGNOSIS — R209 Unspecified disturbances of skin sensation: Secondary | ICD-10-CM | POA: Insufficient documentation

## 2012-07-11 DIAGNOSIS — M79609 Pain in unspecified limb: Secondary | ICD-10-CM | POA: Insufficient documentation

## 2012-07-11 DIAGNOSIS — M545 Low back pain, unspecified: Secondary | ICD-10-CM | POA: Insufficient documentation

## 2012-07-11 DIAGNOSIS — M961 Postlaminectomy syndrome, not elsewhere classified: Secondary | ICD-10-CM

## 2012-07-11 DIAGNOSIS — G8929 Other chronic pain: Secondary | ICD-10-CM | POA: Insufficient documentation

## 2012-07-11 NOTE — Progress Notes (Signed)
Subjective:    Patient ID: Sean Fritz, male    DOB: 04-05-1956, 56 y.o.   MRN: [id number]  HPI 56 year old male who had a work-related injury May 10, 2008. He  was treated by Dr. Danne Harbor over at [facility], had  epidural injections which were not particularly helpful, had a second  opinion from Dr. Shelle Iron, was also seen by Dr. Simonne Come who performed L4-  5 diskectomy. Seen by Dr. Shelle Iron who did a second opinion, no further  surgical intervention was planned. He was maintained on hydrocodone  5/325 t.i.d. for number of years and over the last year q.i.d. in  combination with Neurontin 300 mg t.i.d.  The patient states that he has had some re-injuries after his original  injury, but none of these were at work since he has been out of work  since May 10, 2008. He has been placed on disability Feb 18, 2011.  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 The patient also complains about intermittend diffuse numbness and tingling in his LE.  The problem has been stable.   Pain Inventory Average Pain 6 Pain Right Now 4 My pain is sharp, burning, stabbing, tingling and aching  In the last 24 hours, has pain interfered with the following? General activity 6 Relation with others 6 Enjoyment of life 6 What TIME of day is your pain at its worst? daytime Sleep (in general) Poor  Pain is worse with: walking, bending, sitting and standing Pain improves with: rest, heat/ice and medication Relief from Meds: 6  Mobility walk without assistance ability to climb steps?  yes do you drive?  yes  Function disabled: date disabled   Neuro/Psych bladder control problems numbness tingling spasms  Prior Studies Any changes since last visit?  no  Physicians involved in  your care Any changes since last visit?  no   Family History  Problem Relation Age of Onset  . Cancer Mother   . Lupus Sister    History   Social History  . Marital Status: Single    Spouse Name: N/A    Number of Children: N/A  . Years of Education: N/A   Social History Main Topics  . Smoking status: Former Games developer  . Smokeless tobacco: None  . Alcohol Use: None  . Drug Use: None  . Sexually Active: None   Other Topics Concern  . None   Social History Narrative  . None   History reviewed. No pertinent past surgical history. Past Medical History  Diagnosis Date  . Hypertension   . Depression    BP 164/58  Pulse 87  Resp 16  Ht 5\' 8"  (1.727 m)  Wt 215 lb (97.523 kg)  BMI 32.69 kg/m2  SpO2 99%      Review of Systems  Constitutional: Positive for unexpected weight change.  HENT: Negative.   Eyes: Negative.   Respiratory: Negative.   Cardiovascular: Negative.   Gastrointestinal: Positive for constipation.  Genitourinary: Negative.   Musculoskeletal: Positive for myalgias, back pain and arthralgias.  Skin: Negative.   Neurological: Positive for numbness.  Hematological: Negative.   Psychiatric/Behavioral: Negative.        Objective:   Physical Exam Constitutional: He is oriented to person, place, and time. He appears well-developed and well-nourished.  HENT:  Head: Normocephalic.  Neck: Neck supple.  Musculoskeletal: He exhibits tenderness.  Neurological: He is alert and oriented to person, place, and time.  Skin: Skin is warm and dry.  Psychiatric: He has a normal mood and affect.   Symmetric normal motor tone is noted throughout. Normal muscle bulk. Muscle testing reveals 5/5 muscle strength of the upper extremity, and 5/5 of the lower extremity. Full range of motion in upper and lower extremities. ROM of spine is restricted. Fine motor movements are normal in both hands.  Sensory is intact and symmetric to light touch, pinprick and  proprioception.  DTR in the upper and lower extremity are present and symmetric 2+. No clonus is noted.  Patient arises from chair with mild difficulty. Narrow based gait with normal arm swing bilateral , able to walk on heels and toes . Tandem walk is stable. No pronator drift. Rhomberg negative.  Good finger to nose and heel to shin testing. No tremor, dystaxia or dysmetria noted.         Assessment & Plan:  1. 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 Pt. On disability. Works out on treadmill and Exercise bike. Cont hydrocodone rec restart gabapentin. Advised patient to continue with his exercise program, went through his exercises and determined which one he should continue to do and which one he should not do. Also advised the patient to look into exercising in the water. PA visit in 3 mo  2. No signs of abuse UDS ok,pill counts ok

## 2012-07-11 NOTE — Patient Instructions (Signed)
Continue with walking regularly, and continue with your exercises.

## 2012-08-06 ENCOUNTER — Other Ambulatory Visit: Payer: Self-pay | Admitting: Physical Medicine & Rehabilitation

## 2012-09-06 ENCOUNTER — Other Ambulatory Visit: Payer: Self-pay | Admitting: Physical Medicine & Rehabilitation

## 2012-10-09 ENCOUNTER — Encounter: Payer: Self-pay | Admitting: Physical Medicine and Rehabilitation

## 2012-10-09 ENCOUNTER — Encounter
Payer: Medicare Other | Attending: Physical Medicine and Rehabilitation | Admitting: Physical Medicine and Rehabilitation

## 2012-10-09 VITALS — BP 172/75 | HR 86 | Resp 14 | Ht 68.0 in | Wt 222.0 lb

## 2012-10-09 DIAGNOSIS — G894 Chronic pain syndrome: Secondary | ICD-10-CM

## 2012-10-09 DIAGNOSIS — M961 Postlaminectomy syndrome, not elsewhere classified: Secondary | ICD-10-CM | POA: Insufficient documentation

## 2012-10-09 DIAGNOSIS — I10 Essential (primary) hypertension: Secondary | ICD-10-CM | POA: Insufficient documentation

## 2012-10-09 DIAGNOSIS — Z5181 Encounter for therapeutic drug level monitoring: Secondary | ICD-10-CM

## 2012-10-09 MED ORDER — HYDROCODONE-ACETAMINOPHEN 5-500 MG PO TABS: 1.0000 | ORAL_TABLET | Freq: Four times a day (QID) | ORAL | Status: DC | PRN

## 2012-10-09 NOTE — Patient Instructions (Addendum)
Try to stay as active as pain permits. Swimming would be very beneficial for you, but ask your cardiologist first, whether he approves.

## 2012-10-09 NOTE — Progress Notes (Signed)
Subjective:    Patient ID: Sean Fritz, male    DOB: [birth year], 57 y.o.   MRN: [id number]  HPI 57 year old male who had a work-related injury May 10, 2008. He  was treated by Dr. Danne Harbor over at [facility], had  epidural injections which were not particularly helpful, had a second  opinion from Dr. Shelle Iron, was also seen by Dr. Simonne Come who performed L4-  5 diskectomy. Seen by Dr. Shelle Iron who did a second opinion, no further  surgical intervention was planned. He was maintained on hydrocodone  5/325 t.i.d. for number of years and over the last year q.i.d. in  combination with Neurontin 300 mg t.i.d.  The patient states that he has had some re-injuries after his original  injury, but none of these were at work since he has been out of work  since May 10, 2008. He has been placed on disability Feb 18, 2011.  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 The patient also complains about intermittend diffuse numbness and tingling in his LE.  The problem has been stable.   Pain Inventory Average Pain 6 Pain Right Now 4 My pain is constant, sharp, burning, stabbing, tingling and aching  In the last 24 hours, has pain interfered with the following? General activity 6 Relation with others 6 Enjoyment of life 6 What TIME of day is your pain at its worst? evening Sleep (in general) Poor  Pain is worse with: walking, bending, sitting and standing Pain improves with: rest, heat/ice and medication Relief from Meds: 6  Mobility walk without assistance how many minutes can you walk? 25-30 ability to climb steps?  yes do you drive?  yes  Function disabled: date disabled 2009  Neuro/Psych bladder control problems numbness tingling spasms  Prior Studies Any changes  since last visit?  no  Physicians involved in your care Any changes since last visit?  no   Family History  Problem Relation Age of Onset  . Cancer Mother   . Lupus Sister    History   Social History  . Marital Status: Single    Spouse Name: N/A    Number of Children: N/A  . Years of Education: N/A   Social History Main Topics  . Smoking status: Former Games developer  . Smokeless tobacco: Never Used  . Alcohol Use: None  . Drug Use: None  . Sexually Active: None   Other Topics Concern  . None   Social History Narrative  . None   History reviewed. No pertinent past surgical history. Past Medical History  Diagnosis Date  . Hypertension   . Depression    BP 172/75  Pulse 86  Resp 14  Ht 5\' 8"  (1.727 m)  Wt 222 lb (100.699 kg)  BMI 33.76 kg/m2  SpO2 99%    Review of Systems  Constitutional: Positive for unexpected weight change.  Gastrointestinal: Positive for constipation.  Musculoskeletal: Positive for back pain.       Spasms  Neurological: Positive for numbness.       Tingling  All other systems reviewed and are negative.       Objective:   Physical Exam Constitutional: He is oriented to person, place, and time. He appears well-developed and well-nourished.  HENT:  Head: Normocephalic.  Neck: Neck  supple.  Musculoskeletal: He exhibits tenderness.  Neurological: He is alert and oriented to person, place, and time.  Skin: Skin is warm and dry.  Psychiatric: He has a normal mood and affect.  Symmetric normal motor tone is noted throughout. Normal muscle bulk. Muscle testing reveals 5/5 muscle strength of the upper extremity, and 5/5 of the lower extremity. Full range of motion in upper and lower extremities. ROM of spine is restricted. Fine motor movements are normal in both hands.  Sensory is intact and symmetric to light touch, pinprick and proprioception.  DTR in the upper and lower extremity are present and symmetric 2+. No clonus is noted.  Patient  arises from chair with mild difficulty. Narrow based gait with normal arm swing bilateral , able to walk on heels and toes . Tandem walk is stable. No pronator drift. Rhomberg negative.  Good finger to nose and heel to shin testing. No tremor, dystaxia or dysmetria noted.         Assessment & Plan:  1. 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 Pt. On disability. Works out on treadmill and Exercise bike. Cont hydrocodone rec restart gabapentin. Advised patient to continue with his exercise program, went through his exercises and determined which one he should continue to do and which one he should not do. Also advised the patient to look into exercising in the water, but patient should check with his cardiologist first, whether he would approve this. PA visit in 3 mo  2. No signs of abuse, last UDS ok,pill counts ok Patient might call , to tell us which muscle relexant his insurance would pay for, then we could prescribe.

## 2012-11-10 ENCOUNTER — Other Ambulatory Visit: Payer: Self-pay

## 2012-11-12 ENCOUNTER — Other Ambulatory Visit: Payer: Self-pay | Admitting: Physical Medicine and Rehabilitation

## 2012-11-12 MED ORDER — HYDROCODONE-ACETAMINOPHEN 5-325 MG PO TABS: 1.0000 | ORAL_TABLET | Freq: Four times a day (QID) | ORAL | Status: DC | PRN

## 2012-11-14 ENCOUNTER — Encounter: Payer: Self-pay | Admitting: Physical Medicine & Rehabilitation

## 2012-11-18 ENCOUNTER — Encounter: Payer: Self-pay | Admitting: *Deleted

## 2012-11-22 ENCOUNTER — Telehealth: Payer: Self-pay | Admitting: *Deleted

## 2012-11-22 NOTE — Telephone Encounter (Signed)
His insurance will cover tizanidine muscle relaxer.  He is also going to change his appointment to come in and be seen about the pain medication.  I explained that the difference in what he is taking what he was taking is the tylenol component of the rx. It was 500 mg vs the 325 mg now because it is no longer available in strengths greater than 325mg ..  I told him if he had no liver issues he could add an additional tyleno to what he is taking but he cannot get the 5-500mg  hydrocodone.

## 2012-11-22 NOTE — Telephone Encounter (Signed)
Has been taking the medication Clydie Braun prescribed for 6 days and not getting any relief. Having to take more than ordered. (advised not to do that) Needs something else.

## 2012-11-22 NOTE — Telephone Encounter (Signed)
I did not start him on a new medication, Dr. Doroteo Bradford prescribed the Hydrocodone before. The patient Stated at his last visit, that he wanted to find out for which muscle relaxant his insurance is paying, then he wanted to tell us on the phone so we can call it in.

## 2012-11-23 ENCOUNTER — Ambulatory Visit (HOSPITAL_BASED_OUTPATIENT_CLINIC_OR_DEPARTMENT_OTHER): Payer: Medicare Other | Admitting: Physical Medicine & Rehabilitation

## 2012-11-23 ENCOUNTER — Encounter: Payer: Medicare Other | Attending: Physical Medicine and Rehabilitation

## 2012-11-23 ENCOUNTER — Encounter: Payer: Self-pay | Admitting: Physical Medicine & Rehabilitation

## 2012-11-23 VITALS — BP 146/58 | HR 77 | Resp 14 | Ht 68.0 in | Wt 220.8 lb

## 2012-11-23 DIAGNOSIS — M961 Postlaminectomy syndrome, not elsewhere classified: Secondary | ICD-10-CM | POA: Insufficient documentation

## 2012-11-23 DIAGNOSIS — G894 Chronic pain syndrome: Secondary | ICD-10-CM

## 2012-11-23 DIAGNOSIS — I10 Essential (primary) hypertension: Secondary | ICD-10-CM | POA: Insufficient documentation

## 2012-11-23 MED ORDER — TIZANIDINE HCL 4 MG PO TABS: 2.0000 mg | ORAL_TABLET | Freq: Three times a day (TID) | ORAL | Status: DC | PRN

## 2012-11-23 NOTE — Progress Notes (Signed)
Subjective:    Patient ID: Sean Fritz, male    DOB: July 09, 1956, 57 y.o.   MRN: [id number]  HPI A 57 year old male who had a work-related injury May 10, 2008. He  was treated by Dr. Danne Harbor over at [facility], had  epidural injections which were not particularly helpful, had a second  opinion from Dr. Shelle Iron, was also seen by Dr. Simonne Come who performed L4-  5 diskectomy. Seen by Dr. Shelle Iron who did a second opinion, no further  surgical intervention was planned. He was maintained on hydrocodone  5/325 t.i.d. for number of years and over the last year q.i.d. in  combination with Neurontin 300 mg t.i.d.  The patient states that he has had some re-injuries after his original  injury, but none of these were at work since he has been out of work  since May 10, 2008. He has been placed on disability Feb 18, 2011.  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 Sundays difficult to get out of bed. No radicular pain. Medications not helping very well. Has been seen by my PA last few visits. Recent x-rays at [facility] Brought in MRI from 2010. Could not open disc  Pain Inventory Average Pain 7 Pain Right Now 5 My pain is sharp, burning, stabbing, tingling and aching  In the last 24 hours, has pain interfered with the following? General activity 8 Relation with others 8 Enjoyment of life 8 What TIME of day is your pain at its worst? all Sleep (in general) Poor  Pain is worse with: walking, sitting and standing Pain improves with: rest, heat/ice and medication Relief from Meds: 5  Mobility walk without assistance how many minutes can you walk? 25-30 ability to climb steps?  yes do you drive?  yes  Function disabled: date disabled 2009  Neuro/Psych bladder control problems numbness tingling spasms  Prior Studies Any changes since last visit?   no  Physicians involved in your care Any changes since last visit?  no   Family History  Problem Relation Age of Onset  . Cancer Mother   . Lupus Sister    History   Social History  . Marital Status: Single    Spouse Name: N/A    Number of Children: N/A  . Years of Education: N/A   Social History Main Topics  . Smoking status: Former Games developer  . Smokeless tobacco: Never Used  . Alcohol Use: None  . Drug Use: None  . Sexually Active: None   Other Topics Concern  . None   Social History Narrative  . None   Past Surgical History  Procedure Laterality Date  . Coronary angioplasty with stent placement  12/01/09    2.25 x78mm Taxus Atom Liberte DES stent-diagnal vessel with cutting balloon arthrotomy.   Past Medical History  Diagnosis Date  . Hypertension   . Depression   . Mixed hyperlipidemia   . Degenerative joint disease   . Myocardial infarction 05/24/11    stress Lexiscan Normal; Low risk scan. EF is 58%. Echo 12/07/09/ normal EF > 55%    BP 146/58  Pulse 77  Resp 14  Ht 5\' 8"  (1.727 m)  Wt 220 lb 12.8 oz (100.154 kg)  BMI 33.58 kg/m2  SpO2 97%     Review of Systems  Constitutional: Positive for unexpected weight change.  Gastrointestinal: Positive for constipation.  Genitourinary:       Bladder control problems  Musculoskeletal:       Spasms  Neurological: Positive for numbness.       Tingling  All other systems reviewed and are negative.       Objective:   Physical Exam  Nursing note and vitals reviewed. Constitutional: He is oriented to person, place, and time. He appears well-developed and well-nourished.  HENT:  Head: Normocephalic and atraumatic.  Eyes: Conjunctivae and EOM are normal. Pupils are equal, round, and reactive to light.  Musculoskeletal:       Lumbar back: He exhibits decreased range of motion and tenderness. He exhibits no deformity.  Neurological: He is alert and oriented to person, place, and time. He has normal  strength and normal reflexes. He displays no atrophy. No sensory deficit. He exhibits normal muscle tone. Coordination and gait normal.  Psychiatric: He has a normal mood and affect.          Assessment & Plan:  1.Lumbar, probable discogenic pain.History of postlaminectomy syndrome after discectomy L4-L5. No evidence of any red flags or radiculopathy. We reviewed his treatment thus far We reviewed imaging studies We reviewed current medications The patient is frustrated about lack of improvement. We discussed that narcotic analgesics on a chronic basis may only give a 10-20% response. We can try increasing his 5 mg hydrocodone is to 5 times per day and change his muscle relaxer to Zanaflex. Will reevaluate in 5 or 6 weeks. Once he runs out of the 5 mg hydrocodone to we can up it to 7.5 mg #120 one by mouth 4 times a day. If he shows no improvements at the next M.D. Visit I will order MRI scan

## 2012-11-23 NOTE — Telephone Encounter (Signed)
Great, we can discuss the dosage of the tizanidine when he comes in.

## 2012-11-23 NOTE — Patient Instructions (Signed)
May take 5 hydrocodone as per day Start Zanaflex 2 mg 3 times per day Once you run out of hydrocodone and we will call in 7.5 mg 4 times per day. I will see you back in 6 weeks. If you are no better we will order an MRI scan

## 2012-11-26 ENCOUNTER — Ambulatory Visit: Payer: Medicare Other | Admitting: Physical Medicine and Rehabilitation

## 2012-12-04 ENCOUNTER — Other Ambulatory Visit: Payer: Self-pay | Admitting: Physical Medicine and Rehabilitation

## 2012-12-08 ENCOUNTER — Other Ambulatory Visit: Payer: Self-pay | Admitting: Physical Medicine and Rehabilitation

## 2012-12-26 ENCOUNTER — Encounter: Payer: Self-pay | Admitting: Cardiovascular Disease

## 2013-01-04 ENCOUNTER — Ambulatory Visit (HOSPITAL_BASED_OUTPATIENT_CLINIC_OR_DEPARTMENT_OTHER): Payer: Medicare Other | Admitting: Physical Medicine & Rehabilitation

## 2013-01-04 ENCOUNTER — Encounter: Payer: Medicare Other | Attending: Physical Medicine & Rehabilitation

## 2013-01-04 ENCOUNTER — Encounter: Payer: Self-pay | Admitting: Physical Medicine & Rehabilitation

## 2013-01-04 VITALS — BP 157/73 | HR 75 | Resp 16 | Ht 68.0 in | Wt 216.8 lb

## 2013-01-04 DIAGNOSIS — M545 Low back pain, unspecified: Secondary | ICD-10-CM | POA: Insufficient documentation

## 2013-01-04 DIAGNOSIS — E782 Mixed hyperlipidemia: Secondary | ICD-10-CM | POA: Insufficient documentation

## 2013-01-04 DIAGNOSIS — M533 Sacrococcygeal disorders, not elsewhere classified: Secondary | ICD-10-CM

## 2013-01-04 DIAGNOSIS — G894 Chronic pain syndrome: Secondary | ICD-10-CM

## 2013-01-04 DIAGNOSIS — M47817 Spondylosis without myelopathy or radiculopathy, lumbosacral region: Secondary | ICD-10-CM

## 2013-01-04 DIAGNOSIS — I252 Old myocardial infarction: Secondary | ICD-10-CM | POA: Insufficient documentation

## 2013-01-04 DIAGNOSIS — M961 Postlaminectomy syndrome, not elsewhere classified: Secondary | ICD-10-CM

## 2013-01-04 DIAGNOSIS — I10 Essential (primary) hypertension: Secondary | ICD-10-CM | POA: Insufficient documentation

## 2013-01-04 MED ORDER — HYDROCODONE-ACETAMINOPHEN 7.5-325 MG PO TABS: 1.0000 | ORAL_TABLET | Freq: Four times a day (QID) | ORAL | Status: DC | PRN

## 2013-01-04 MED ORDER — HYDROCODONE-ACETAMINOPHEN 5-325 MG PO TABS: 1.0000 | ORAL_TABLET | Freq: Four times a day (QID) | ORAL | Status: DC | PRN

## 2013-01-04 NOTE — Progress Notes (Signed)
Subjective:    Patient ID: Sean Fritz, male    DOB: [birth year], 57 y.o.   MRN: [id number]  HPI A 57 year old male who had a work-related injury May 10, 2008. He  was treated by Dr. Danne Harbor over at [facility], had  epidural injections which were not particularly helpful, had a second  opinion from Dr. Shelle Iron, was also seen by Dr. Simonne Come who performed L4-  5 diskectomy. Seen by Dr. Shelle Iron who did a second opinion, no further  surgical intervention was planned. He was maintained on hydrocodone  5/325 t.i.d. for number of years and over the last year q.i.d. in  combination with Neurontin 300 mg t.i.d.  The patient states that he has had some re-injuries after his original  injury, but none of these were at work since he has been out of work  since May 10, 2008. He has been placed on disability Feb 18, 2011.  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 General activity 7 Relation with others 6 Enjoyment of life 7 What TIME of day is your pain at its worst? evening Sleep (in general) Poor  Pain is worse with: walking, bending, sitting, standing and some activites Pain improves with: rest, heat/ice and medication Relief from Meds: 5  Mobility walk without assistance how many minutes can you walk? 20 ability to climb steps?  yes do you drive?  yes  Function disabled: date disabled 2012  Neuro/Psych bladder control problems numbness tingling trouble walking spasms  Prior Studies Any changes since last visit?  yes x-rays CT/MRI brought disc  Physicians involved in your care Any changes since last visit?  no   Family History  Problem Relation Age of Onset  . Cancer Mother   . Lupus Sister     History   Social History  . Marital Status: Single    Spouse Name: N/A    Number of Children: N/A  . Years of Education: N/A   Social History Main Topics  . Smoking status: Former Games developer  . Smokeless tobacco: Never Used  . Alcohol Use: None  . Drug Use: None  . Sexually Active: None   Other Topics Concern  . None   Social History Narrative  . None   Past Surgical History  Procedure Laterality Date  . Coronary angioplasty with stent placement  12/01/09    2.25 x31mm Taxus Atom Liberte DES stent-diagnal vessel with cutting balloon arthrotomy.   Past Medical History  Diagnosis Date  . Hypertension   . Depression   . Mixed hyperlipidemia   . Degenerative joint disease   . Myocardial infarction 05/24/11    stress Lexiscan Normal; Low risk scan. EF is 58%. Echo 12/07/09/ normal EF > 55%    BP 157/73  Pulse 75  Resp 16  Ht 5\' 8"  (1.727 m)  Wt 216 lb 12.8 oz (98.34 kg)  BMI 32.97 kg/m2  SpO2 98%    Review of Systems  Constitutional: Positive for unexpected weight change.  Gastrointestinal: Positive for constipation.  Genitourinary:       Bladder control problems  Musculoskeletal: Positive for back pain and gait problem.  Neurological: Positive for numbness.       Tingling  All other systems  reviewed and are negative.       Objective:   Physical Exam  Nursing note and vitals reviewed.  Constitutional: He is oriented to person, place, and time. He appears well-developed and well-nourished.  HENT:  Head: Normocephalic and atraumatic.  Eyes: Conjunctivae and EOM are normal. Pupils are equal, round, and reactive to light.  Musculoskeletal:  Lumbar back: He exhibits decreased range of motion and tenderness. He exhibits no deformity.  Neurological: He is alert and oriented to person, place, and time. He has normal strength and normal reflexes. He displays no atrophy. No sensory deficit. He exhibits normal muscle tone. Coordination and gait normal.   Psychiatric: He has a normal mood and affect.  Positive Waddell sign pain with even light palpation of his lumbar spine Reduced lumbar range of motion forward flexion extension lateral Rotation and bending      Assessment & Plan:  1.Lumbar, probable discogenic pain.History of postlaminectomy syndrome after discectomy L4-L5. No evidence of any red flags or radiculopathy.  We reviewed his treatment thus far  We reviewed imaging studies ,Sacroiliac and lumbar facet arthropathy would need to come off of Plavix he will check with his primary M.D. We reviewed current medications  The patient is frustrated about lack of improvement. We discussed that narcotic analgesics on a chronic basis may only give a 10-20% response. We can try increasing his 5 mg hydrocodone is to 5 times per day and change his muscle relaxer to Zanaflex. Will reevaluate in 5 or 6 weeks.  Once he runs out of the 5 mg hydrocodone to we can up it to 7.5 mg #120 one by mouth 4 times a day.

## 2013-01-04 NOTE — Patient Instructions (Signed)
Recommend continuing working out. He may want to try reducing your treadmill x10 minutes and increase by only one minute per week Increased hydrocodone 7.5 mg 4 times per day. One month supply. Return to clinic one month. We will discuss the sacroiliac injections or lumbar facet injections I will give you some further information on these as well

## 2013-01-07 ENCOUNTER — Ambulatory Visit: Payer: Medicare Other | Admitting: Physical Medicine and Rehabilitation

## 2013-01-17 ENCOUNTER — Other Ambulatory Visit: Payer: Self-pay | Admitting: Physical Medicine & Rehabilitation

## 2013-01-17 MED ORDER — TIZANIDINE HCL 4 MG PO TABS: 2.0000 mg | ORAL_TABLET | Freq: Three times a day (TID) | ORAL | Status: DC | PRN

## 2013-02-12 ENCOUNTER — Other Ambulatory Visit: Payer: Self-pay | Admitting: Physical Medicine & Rehabilitation

## 2013-02-12 MED ORDER — HYDROCODONE-ACETAMINOPHEN 7.5-325 MG PO TABS: 1.0000 | ORAL_TABLET | Freq: Four times a day (QID) | ORAL | Status: DC | PRN

## 2013-02-15 ENCOUNTER — Ambulatory Visit: Payer: Medicare Other | Admitting: Physical Medicine & Rehabilitation

## 2013-03-06 ENCOUNTER — Other Ambulatory Visit: Payer: Self-pay | Admitting: *Deleted

## 2013-03-06 MED ORDER — METOPROLOL SUCCINATE ER 50 MG PO TB24: 50.0000 mg | ORAL_TABLET | Freq: Every day | ORAL | Status: DC

## 2013-03-06 MED ORDER — RAMIPRIL 10 MG PO TABS: 10.0000 mg | ORAL_TABLET | Freq: Every day | ORAL | Status: DC

## 2013-03-06 MED ORDER — ISOSORBIDE MONONITRATE ER 60 MG PO TB24: 60.0000 mg | ORAL_TABLET | Freq: Every day | ORAL | Status: DC

## 2013-03-06 MED ORDER — ATORVASTATIN CALCIUM 80 MG PO TABS: 80.0000 mg | ORAL_TABLET | Freq: Every day | ORAL | Status: DC

## 2013-03-06 MED ORDER — AMLODIPINE BESYLATE 10 MG PO TABS: 10.0000 mg | ORAL_TABLET | Freq: Every day | ORAL | Status: DC

## 2013-03-06 MED ORDER — CLOPIDOGREL BISULFATE 75 MG PO TABS: 75.0000 mg | ORAL_TABLET | Freq: Every day | ORAL | Status: DC

## 2013-03-06 NOTE — Telephone Encounter (Signed)
Refilled patients isosorbide, atorvastatin,clopidigrel,ramipril, amlodipine and toprol xl tooptum rx.

## 2013-03-07 ENCOUNTER — Other Ambulatory Visit: Payer: Self-pay | Admitting: *Deleted

## 2013-03-07 MED ORDER — ISOSORBIDE MONONITRATE ER 60 MG PO TB24: 60.0000 mg | ORAL_TABLET | Freq: Every day | ORAL | Status: DC

## 2013-03-07 MED ORDER — CLOPIDOGREL BISULFATE 75 MG PO TABS: 75.0000 mg | ORAL_TABLET | Freq: Every day | ORAL | Status: DC

## 2013-03-07 MED ORDER — METOPROLOL SUCCINATE ER 50 MG PO TB24: 50.0000 mg | ORAL_TABLET | Freq: Every day | ORAL | Status: DC

## 2013-03-07 MED ORDER — ATORVASTATIN CALCIUM 80 MG PO TABS: 80.0000 mg | ORAL_TABLET | Freq: Every day | ORAL | Status: DC

## 2013-03-07 MED ORDER — AMLODIPINE BESYLATE 10 MG PO TABS: 10.0000 mg | ORAL_TABLET | Freq: Every day | ORAL | Status: DC

## 2013-03-07 MED ORDER — RAMIPRIL 10 MG PO TABS: 10.0000 mg | ORAL_TABLET | Freq: Every day | ORAL | Status: DC

## 2013-03-11 ENCOUNTER — Other Ambulatory Visit: Payer: Self-pay | Admitting: *Deleted

## 2013-03-15 ENCOUNTER — Ambulatory Visit (HOSPITAL_BASED_OUTPATIENT_CLINIC_OR_DEPARTMENT_OTHER): Payer: Medicare Other | Admitting: Physical Medicine & Rehabilitation

## 2013-03-15 ENCOUNTER — Encounter: Payer: Medicare Other | Attending: Physical Medicine & Rehabilitation

## 2013-03-15 ENCOUNTER — Encounter: Payer: Self-pay | Admitting: Physical Medicine & Rehabilitation

## 2013-03-15 VITALS — BP 139/65 | HR 78 | Resp 14 | Ht 68.0 in | Wt 212.0 lb

## 2013-03-15 DIAGNOSIS — M545 Low back pain, unspecified: Secondary | ICD-10-CM | POA: Insufficient documentation

## 2013-03-15 DIAGNOSIS — E782 Mixed hyperlipidemia: Secondary | ICD-10-CM | POA: Insufficient documentation

## 2013-03-15 DIAGNOSIS — I252 Old myocardial infarction: Secondary | ICD-10-CM | POA: Insufficient documentation

## 2013-03-15 DIAGNOSIS — G894 Chronic pain syndrome: Secondary | ICD-10-CM

## 2013-03-15 DIAGNOSIS — M961 Postlaminectomy syndrome, not elsewhere classified: Secondary | ICD-10-CM | POA: Insufficient documentation

## 2013-03-15 DIAGNOSIS — Z79899 Other long term (current) drug therapy: Secondary | ICD-10-CM

## 2013-03-15 DIAGNOSIS — I10 Essential (primary) hypertension: Secondary | ICD-10-CM | POA: Insufficient documentation

## 2013-03-15 DIAGNOSIS — Z5181 Encounter for therapeutic drug level monitoring: Secondary | ICD-10-CM

## 2013-03-15 MED ORDER — HYDROCODONE-ACETAMINOPHEN 7.5-325 MG PO TABS: 1.0000 | ORAL_TABLET | Freq: Four times a day (QID) | ORAL | Status: DC | PRN

## 2013-03-15 MED ORDER — DIAZEPAM 10 MG PO TABS: 10.0000 mg | ORAL_TABLET | Freq: Once | ORAL | Status: DC

## 2013-03-15 NOTE — Progress Notes (Signed)
Subjective:    Patient ID: Sean Fritz, male    DOB: 1956/06/22, 57 y.o.   MRN: [id number]  HPI A 57 year old male who had a work-related injury May 10, 2008. He  was treated by Dr. Danne Harbor over at [facility], had  epidural injections which were not particularly helpful, had a second  opinion from Dr. Shelle Iron, was also seen by Dr. Simonne Come who performed L4-  5 diskectomy. Seen by Dr. Shelle Iron who did a second opinion, no further  surgical intervention was planned. He was maintained on hydrocodone  5/325 t.i.d. for number of years and over the last year q.i.d. in  combination with Neurontin 300 mg t.i.d.  The patient states that he has had some re-injuries after his original  injury, but none of these were at work since he has been out of work  since May 10, 2008. He has been placed on disability Feb 18, 2011.  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 General activity 7 Relation with others 7 Enjoyment of life 7 What TIME of day is your pain at its worst? daytime and evening Sleep (in general) Fair  Pain is worse with: walking, bending, sitting and standing Pain improves with: rest, heat/ice and medication Relief from Meds: 6  Mobility walk without assistance ability to climb steps?  yes do you drive?  yes  Function disabled: date disabled 2009  Neuro/Psych bladder control problems numbness tingling spasms  Prior Studies Any changes since last visit?  no  Physicians involved in your care Any changes since last visit?  no   Family History  Problem Relation Age of Onset  . Cancer Mother   . Lupus Sister    History   Social History  . Marital Status: Single    Spouse Name: N/A    Number  of Children: N/A  . Years of Education: N/A   Social History Main Topics  . Smoking status: Former Games developer  . Smokeless tobacco: Never Used  . Alcohol Use: None  . Drug Use: None  . Sexually Active: None   Other Topics Concern  . None   Social History Narrative  . None   Past Surgical History  Procedure Laterality Date  . Coronary angioplasty with stent placement  12/01/09    2.25 x66mm Taxus Atom Liberte DES stent-diagnal vessel with cutting balloon arthrotomy.   Past Medical History  Diagnosis Date  . Hypertension   . Depression   . Mixed hyperlipidemia   . Degenerative joint disease   . Myocardial infarction 05/24/11    stress Lexiscan Normal; Low risk scan. EF is 58%. Echo 12/07/09/ normal EF > 55%    BP 139/65  Pulse 78  Resp 14  Ht 5\' 8"  (1.727 m)  Wt 212 lb (96.163 kg)  BMI 32.24 kg/m2  SpO2 97%    Review of Systems  Gastrointestinal: Positive for constipation.  Endocrine:       High blood sugars  Genitourinary:       Bladder control problems  Musculoskeletal:       Spasms  Neurological: Positive for numbness.       Tingling  All other systems reviewed and are negative.       Objective:  Physical Exam  Nursing note and vitals reviewed.  Constitutional: He is oriented to person, place, and time. He appears well-developed and well-nourished.  HENT:  Head: Normocephalic and atraumatic.  Eyes: Conjunctivae and EOM are normal. Pupils are equal, round, and reactive to light.  Musculoskeletal:  Lumbar back: He exhibits decreased range of motion and tenderness. He exhibits no deformity.  Neurological: He is alert and oriented to person, place, and time. He has normal strength and normal reflexes. He displays no atrophy. No sensory deficit. He exhibits normal muscle tone. Coordination and gait normal.  Psychiatric: He has a normal mood and affect.  Positive Waddell sign pain with even light palpation of his lumbar spine  Reduced lumbar range of motion  forward flexion extension lateral Rotation        Assessment & Plan:  1.Lumbar, probable discogenic pain.History of postlaminectomy syndrome after discectomy L4-L5. No evidence of any red flags or radiculopathy.  We reviewed his treatment thus far  We reviewed imaging studies ,Sacroiliac and lumbar facet arthropathy would need to come off of Plavix he will check with his cardiologist. He can stay on low-dose aspirin while he is off the Plavix for 5-7 days We reviewed current medications  The patient is frustrated about lack of improvement. We discussed that narcotic analgesics on a chronic basis may only give a 10-20% response hydrocodone  7.5 mg #120 one by mouth 4 times a day.   Will schedule lumbar medial branch blocks. Premedicate with Valium 10 mg.

## 2013-03-15 NOTE — Patient Instructions (Addendum)
To get a spine injection, you need to be off of Plavix for 5-7 days.You may take 81 mg - 325mg  aspirin during that time  Facet Joint Block The facet joints connect the bones of the spine (vertebrae). They make it possible for you to bend, twist, and make other movements with your spine. They also prevent you from overbending, overtwisting, and making other excessive movements.  A facet joint block is a procedure where a numbing medicine (anesthetic) is injected into a facet joint. Often times, a type of anti-inflammatory medicine called a steroid is also injected. A facet joint block may be done for two reasons:   Diagnosis. A facet joint block may be done as a test to see whether neck or back pain is caused by a worn-down or infected facet joint. If the pain gets better after a facet joint block, it means the pain is probably coming from the facet joint. If the pain does not get better, it means the pain is probably not coming from the fact joint.   Therapy. A facet joint block may be done to relieve neck or back pain caused by a facet joint. A facet joint block is only done as a therapy if the pain does not improve with medicine, exercise programs, physical therapy, and other forms of pain management. LET YOUR CAREGIVER KNOW ABOUT:   Any allergies you have.   All medicines you are taking, including vitamins, herbs, eyedrops, and over-the-counter medicines and creams.   Previous problems you or members of your family have had with the use of anesthetics.   Any blood disorders you have had.   Other health problems you have. RISKS AND COMPLICATIONS Generally, having a facet joint block is safe. However, as with any procedure, complications can occur. Possible complications associated with having a facet joint block include:   Bleeding.   Injury to a nerve near the injection site.   Pain at the injection site.   Weakness or numbness in areas controlled by nerves near the injection  site.   Infection.   Temporary fluid retention.   Allergic reaction to anesthetics or medicines used during the procedure. BEFORE THE PROCEDURE   Follow your caregiver's instructions if you are taking dietary supplements or medicines. You may need to stop taking them or reduce your dosage.   Do not take any new dietary supplements or medicines without asking your caregiver first.   Follow your caregiver's instructions about eating and drinking before the procedure. You may need to stop eating and drinking several hours before the procedure.   Arrange to have an adult drive you home after the procedure. PROCEDURE  You may need to remove your clothing and dress in an open-back gown so that your caregiver can access your spine.   The procedure will be done while you are lying on an X-ray table. Most of the time you will be asked to lie on your stomach, but you may be asked to lie in a different position if an injection will be made in your neck.   Special machines will be used to monitor your oxygen levels, heart rate, and blood pressure.   If an injection will be made in your neck, an intravenous (IV) tube will be inserted into one of your veins. Fluids and medicine will flow directly into your body through the IV tube.   The area over the facet joint where the injection will be made will be cleaned with an antiseptic soap. The surrounding  skin will be covered with sterile drapes.   An anesthetic will be applied to your skin to make the injection area numb. You may feel a temporary stinging or burning sensation.   A video X-ray machine will be used to locate the joint. A contrast dye may be injected into the facet joint area to help with locating the joint.   When the joint is located, an anesthetic medicine will be injected into the joint through the needle.   Your caregiver will ask you whether you feel pain relief. If you do feel relief, a steroid may be injected to  provide pain relief for a longer period of time. If you do not feel relief or feel only partial relief, additional injections of an anesthetic may be made in other facet joints.   The needle will be removed, the skin will be cleansed, and bandages will be applied.   The procedure usually takes less than a half hour to perform. AFTER THE PROCEDURE   You will be observed for 15 30 minutes before being allowed to go home. Do not drive. Have an adult drive you or take a taxi or public transportation instead.   If you feel pain relief, the pain will return in several hours or days when the anesthetic wears off.   You may feel pain relief 2 14 days after the procedure. The amount of time this relief lasts varies from person to person.   It is normal to feel some tenderness over the injected area(s) for 2 days following the procedure.   If you have diabetes, you may have a temporary increase in blood sugar. Document Released: 02/01/2007 Document Revised: 08/29/2012 Document Reviewed: 07/02/2012 [facility] Patient Information 2014 [place], [place].

## 2013-03-18 ENCOUNTER — Encounter: Payer: Self-pay | Admitting: Cardiovascular Disease

## 2013-03-18 ENCOUNTER — Ambulatory Visit (INDEPENDENT_AMBULATORY_CARE_PROVIDER_SITE_OTHER): Payer: Medicare Other | Admitting: Cardiovascular Disease

## 2013-03-18 VITALS — BP 120/66 | HR 59 | Ht 68.0 in | Wt 211.7 lb

## 2013-03-18 DIAGNOSIS — I119 Hypertensive heart disease without heart failure: Secondary | ICD-10-CM

## 2013-03-18 DIAGNOSIS — E785 Hyperlipidemia, unspecified: Secondary | ICD-10-CM

## 2013-03-18 DIAGNOSIS — I10 Essential (primary) hypertension: Secondary | ICD-10-CM

## 2013-03-18 DIAGNOSIS — E669 Obesity, unspecified: Secondary | ICD-10-CM

## 2013-03-18 DIAGNOSIS — I2510 Atherosclerotic heart disease of native coronary artery without angina pectoris: Secondary | ICD-10-CM

## 2013-03-18 NOTE — Patient Instructions (Signed)
Your physician recommends that you schedule a follow-up appointment in: 6 months. Your physician has recommended you make the following change in your medication: PLAVIX AS DIRECTED PER DR. KELLY FOR BACK INJECTIONS.

## 2013-03-20 ENCOUNTER — Telehealth: Payer: Self-pay | Admitting: Cardiovascular Disease

## 2013-03-20 MED ORDER — METOPROLOL SUCCINATE ER 50 MG PO TB24: ORAL_TABLET | ORAL | Status: DC

## 2013-03-20 NOTE — Telephone Encounter (Signed)
Need to verify the directions on his Metoprolol  Prescription! Reference#[id number]

## 2013-03-20 NOTE — Telephone Encounter (Signed)
Reviewed med list and two sets of directions in sig.  Resent to take metoprolol succinate 75mg  (1 1/2 tabs daily).

## 2013-03-24 ENCOUNTER — Encounter: Payer: Self-pay | Admitting: Cardiovascular Disease

## 2013-03-24 DIAGNOSIS — E669 Obesity, unspecified: Secondary | ICD-10-CM | POA: Insufficient documentation

## 2013-03-24 DIAGNOSIS — I10 Essential (primary) hypertension: Secondary | ICD-10-CM | POA: Insufficient documentation

## 2013-03-24 DIAGNOSIS — I2510 Atherosclerotic heart disease of native coronary artery without angina pectoris: Secondary | ICD-10-CM | POA: Insufficient documentation

## 2013-03-24 DIAGNOSIS — E785 Hyperlipidemia, unspecified: Secondary | ICD-10-CM | POA: Insufficient documentation

## 2013-03-24 NOTE — Progress Notes (Signed)
Patient ID: Sean Fritz, male   DOB: Oct 02, 1955, 57 y.o.   MRN: [id number]     HPI: Sean Fritz, is a 57 y.o. male with known coronary artery disease who presents to the office today for followup cardiology evaluation. He tells me he will need to have a facet injection by Dr. Doroteo Bradford at the pain center on 04/19/2013.  Sean Fritz has known coronary artery disease. In January 1998 he suffered a non-Q-wave MI and underwent PTCA of his mid distal right carotid artery as well as proximal circumflex coronary artery. In July 1998 he underwent stenting of his right coronary artery. In March 2011 he underwent stenting of a 90% diagonal stenosis. Additional problems include mixed hyperlipidemia with low HDL levels and high triglycerides consistent with an atherogenic dyslipidemia pattern, hypertension, degenerative joint disease, as well as mild obesity. Over the past 6 months, he has remained stable. He denies any recurrent anginal symptoms. He tells me you'll be needing a facet injection at L3-4 and 5 at the pain clinic at the end of July and we'll need to hold his aspirin and Plavix for this.  Past Medical History  Diagnosis Date  . Hypertension   . Depression   . Mixed hyperlipidemia   . Degenerative joint disease   . Myocardial infarction 05/24/11    stress Lexiscan Normal; Low risk scan. EF is 58%. Echo 12/07/09/ normal EF > 55%     Past Surgical History  Procedure Laterality Date  . Coronary angioplasty with stent placement  12/01/09    2.25 x23mm Taxus Atom Liberte DES stent-diagnal vessel with cutting balloon arthrotomy.    Allergies  Allergen Reactions  . No Known Drug Allergy     Current Outpatient Prescriptions  Medication Sig Dispense Refill  . amLODipine (NORVASC) 10 MG tablet Take 1 tablet (10 mg total) by mouth daily.  90 tablet  3  . aspirin 81 MG tablet Take 160 mg by mouth daily.      Marland Kitchen atorvastatin (LIPITOR) 80 MG tablet Take 1 tablet (80 mg total) by mouth daily.  90  tablet  3  . buPROPion (WELLBUTRIN XL) 150 MG 24 hr tablet Take 150 mg by mouth 3 (three) times daily.      . clopidogrel (PLAVIX) 75 MG tablet Take 1 tablet (75 mg total) by mouth daily.  90 tablet  3  . diazepam (VALIUM) 10 MG tablet Take 1 tablet (10 mg total) by mouth once.  3 tablet  0  . gabapentin (NEURONTIN) 300 MG capsule Take 300 mg by mouth 3 (three) times daily as needed.       Marland Kitchen HYDROcodone-acetaminophen (NORCO) 7.5-325 MG per tablet Take 1 tablet by mouth every 6 (six) hours as needed for pain. Must last 30 days.  120 tablet  0  . isosorbide mononitrate (IMDUR) 60 MG 24 hr tablet Take 1 tablet (60 mg total) by mouth daily.  90 tablet  3  . niacin (NIASPAN) 500 MG CR tablet Take 1,000 mg by mouth at bedtime.       . nitroGLYCERIN (NITROSTAT) 0.4 MG SL tablet Place 0.4 mg under the tongue as needed.      . ramipril (ALTACE) 10 MG tablet Take 1 tablet (10 mg total) by mouth daily.  90 tablet  3  . tiZANidine (ZANAFLEX) 4 MG tablet Take 0.5 tablets (2 mg total) by mouth every 8 (eight) hours as needed.  30 tablet  2  . chlorhexidine (PERIDEX) 0.12 % solution       .  metoprolol succinate (TOPROL-XL) 50 MG 24 hr tablet Take 1 &1/2 tablets daily  135 tablet  3   No current facility-administered medications for this visit.    Socially he is divorced. There is a remote tobacco history. He does try to exercise but he has been limited by his significant back discomfort making it very difficult to  ROS is negative for fevers, chills or night sweats.  He denies awareness of arrhythmias. He denies PND or orthopnea. He denies presyncope or syncope. There is no shortness of breath. He does have chronic back pain. 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 He denies abdominal pain. He denies bleeding. He denies GI symptoms. He has had issues with erectile function. He denies recent edema. Other system review is negative.  PE BP 120/66  Pulse  59  Ht 5\' 8"  (1.727 m)  Wt 211 lb 11.2 oz (96.026 kg)  BMI 32.2 kg/m2  General: Alert, oriented, no distress.  Skin: normal turgor, no rashes HEENT: Normocephalic, atraumatic. Pupils round and reactive; sclera anicteric;no lid lag.  Nose without nasal septal hypertrophy Mouth/Parynx benign; Mallinpatti scale to Neck: No JVD, no carotid briuts Lungs: clear to ausculatation and percussion; no wheezing or rales Heart: RRR, s1 s2 normal no S3 or S4 gallops. There was a whiff of a systolic murmur. Abdomen: soft, nontender; no hepatosplenomehaly, BS+; abdominal aorta nontender and not dilated by palpation. Pulses 2+ Extremities: no clubbing cyanosis or edema, Homan's sign negative  Neurologic: grossly nonfocal  ECG: Sinus rhythm at 59 beats per minute. No ectopy. Intervals normal.  LABS:  BMET    Component Value Date/Time   NA 137 12/12/2009 0350   K 3.8 12/12/2009 0350   CL 104 12/12/2009 0350   CO2 26 12/12/2009 0350   GLUCOSE 121* 12/12/2009 0350   BUN 12 12/12/2009 0350   CREATININE 0.98 12/12/2009 0350   CALCIUM 8.6 12/12/2009 0350   GFRNONAA >60 12/12/2009 0350   GFRAA  Value: >60        The eGFR has been calculated using the MDRD equation. This calculation has not been validated in all clinical situations. eGFR's persistently <60 mL/min signify possible Chronic Kidney Disease. 12/12/2009 0350     Hepatic Function Panel  No results found for this basename: prot, albumin, ast, alt, alkphos, bilitot, bilidir, ibili     CBC    Component Value Date/Time   WBC 9.8 12/12/2009 0350   RBC 4.12* 12/12/2009 0350   HGB 13.2 12/12/2009 0350   HCT 37.2* 12/12/2009 0350   PLT 164 12/12/2009 0350   MCV 90.2 12/12/2009 0350   MCHC 35.4 12/12/2009 0350   RDW 12.5 12/12/2009 0350     BNP No results found for this basename: probnp    Lipid Panel  No results found for this basename: chol, trig, hdl, cholhdl, vldl, ldlcalc     RADIOLOGY: No results found.    ASSESSMENT AND  PLAN: Sean Fritz is doing well from cardiac standpoint and is s/p remote intervention to his right coronary artery, circumflex vessel, as well as diagonal vessels. He does have an atherogenic dyslipidemia pattern but seems to be tolerating Niaspan atorvastatin combination with improved HDL to 38 and LDL 69, and VLDL 24 last fall.  His blood pressure is well controlled. His ECG is stable. He was given clearance to undergo his facet injection. He will need to hold his aspirin and Plavix for 5 days prior to the procedure. I will  see him in 6 months for cardiology reassessment.    Lennette Bihari, MD, [facility]  03/24/2013 7:19 PM

## 2013-04-02 ENCOUNTER — Telehealth: Payer: Self-pay | Admitting: Cardiovascular Disease

## 2013-04-02 MED ORDER — AMLODIPINE BESYLATE 10 MG PO TABS: 10.0000 mg | ORAL_TABLET | Freq: Every day | ORAL | Status: DC

## 2013-04-02 NOTE — Telephone Encounter (Signed)
Returned call.  Pt stated he never got amlodipine in the mail.  Named other meds received.  Pt informed they were all sent on the same day and RN will resend script now.  Pt stated he has 11 days of medicine and advised to call back if he runs low for short supply to be sent to local pharmacy if mail order not received by then.  Pt verbalized understanding and agreed w/ plan.  Refill(s) sent to pharmacy and confirmed received.

## 2013-04-02 NOTE — Telephone Encounter (Signed)
Was here on the 23rd of June-a new prescription was suppose to be called in-Please call him!

## 2013-04-10 ENCOUNTER — Encounter: Payer: Self-pay | Admitting: Physical Medicine & Rehabilitation

## 2013-04-16 ENCOUNTER — Encounter: Payer: Self-pay | Admitting: Physical Medicine & Rehabilitation

## 2013-04-16 ENCOUNTER — Encounter: Payer: Medicare Other | Attending: Physical Medicine & Rehabilitation

## 2013-04-16 ENCOUNTER — Ambulatory Visit (HOSPITAL_BASED_OUTPATIENT_CLINIC_OR_DEPARTMENT_OTHER): Payer: Medicare Other | Admitting: Physical Medicine & Rehabilitation

## 2013-04-16 VITALS — BP 116/65 | HR 63 | Resp 15 | Ht 68.0 in | Wt 210.0 lb

## 2013-04-16 DIAGNOSIS — I10 Essential (primary) hypertension: Secondary | ICD-10-CM | POA: Insufficient documentation

## 2013-04-16 DIAGNOSIS — I252 Old myocardial infarction: Secondary | ICD-10-CM | POA: Insufficient documentation

## 2013-04-16 DIAGNOSIS — M545 Low back pain, unspecified: Secondary | ICD-10-CM | POA: Insufficient documentation

## 2013-04-16 DIAGNOSIS — E782 Mixed hyperlipidemia: Secondary | ICD-10-CM | POA: Insufficient documentation

## 2013-04-16 DIAGNOSIS — M47817 Spondylosis without myelopathy or radiculopathy, lumbosacral region: Secondary | ICD-10-CM

## 2013-04-16 DIAGNOSIS — M961 Postlaminectomy syndrome, not elsewhere classified: Secondary | ICD-10-CM | POA: Insufficient documentation

## 2013-04-16 MED ORDER — HYDROCODONE-ACETAMINOPHEN 7.5-325 MG PO TABS: 1.0000 | ORAL_TABLET | Freq: Four times a day (QID) | ORAL | Status: DC | PRN

## 2013-04-16 NOTE — Progress Notes (Signed)
  PROCEDURE RECORD [facility]   Name: Sean Fritz DOB:10/19/1955 MRN: [id number]  Date:04/16/2013  Physician: Claudette Laws, MD    Nurse/CMA: Shumaker RN  Allergies:  Allergies  Allergen Reactions  . No Known Drug Allergy     Consent Signed: yes  Is patient diabetic? no  CBG today? na  Pregnant: no LMP: No LMP for male patient. (age 57-55)  Anticoagulants: yes (last dose of plavix was 04-08-13) Anti-inflammatory: no Antibiotics: no  Has taken a valium 10mg  prior to injection as ordered  Procedure: Bilateral Medial Branch Blocks L3-4-5   Position: Prone Start Time: 2:26p  End Time: 2:37  Fluoro Time:41 seconds  RN/CMA Terrial Rhodes Shumaker RN    Time 200 2:42    BP 116/65 133/59    Pulse 63 64    Respirations 15 14    O2 Sat 95 96    S/S 6 6    Pain Level 4 2/10     D/C home with Synetta Fail, girlfriend, patient A & O X 3, D/C instructions reviewed, and sits independently.

## 2013-04-16 NOTE — Patient Instructions (Signed)

## 2013-04-16 NOTE — Progress Notes (Signed)

## 2013-05-17 ENCOUNTER — Encounter: Payer: Medicare Other | Attending: Physical Medicine & Rehabilitation

## 2013-05-17 ENCOUNTER — Ambulatory Visit (HOSPITAL_BASED_OUTPATIENT_CLINIC_OR_DEPARTMENT_OTHER): Payer: Medicare Other | Admitting: Physical Medicine & Rehabilitation

## 2013-05-17 ENCOUNTER — Encounter: Payer: Self-pay | Admitting: Physical Medicine & Rehabilitation

## 2013-05-17 VITALS — BP 155/74 | HR 78 | Resp 14 | Ht 68.0 in | Wt 211.6 lb

## 2013-05-17 DIAGNOSIS — M545 Low back pain, unspecified: Secondary | ICD-10-CM | POA: Insufficient documentation

## 2013-05-17 DIAGNOSIS — M216X9 Other acquired deformities of unspecified foot: Secondary | ICD-10-CM

## 2013-05-17 DIAGNOSIS — M961 Postlaminectomy syndrome, not elsewhere classified: Secondary | ICD-10-CM | POA: Insufficient documentation

## 2013-05-17 DIAGNOSIS — E782 Mixed hyperlipidemia: Secondary | ICD-10-CM | POA: Insufficient documentation

## 2013-05-17 DIAGNOSIS — M47817 Spondylosis without myelopathy or radiculopathy, lumbosacral region: Secondary | ICD-10-CM

## 2013-05-17 DIAGNOSIS — I252 Old myocardial infarction: Secondary | ICD-10-CM | POA: Insufficient documentation

## 2013-05-17 DIAGNOSIS — M21372 Foot drop, left foot: Secondary | ICD-10-CM

## 2013-05-17 DIAGNOSIS — I10 Essential (primary) hypertension: Secondary | ICD-10-CM | POA: Insufficient documentation

## 2013-05-17 MED ORDER — HYDROCODONE-ACETAMINOPHEN 7.5-325 MG PO TABS: 1.0000 | ORAL_TABLET | Freq: Four times a day (QID) | ORAL | Status: DC | PRN

## 2013-05-17 NOTE — Progress Notes (Signed)
Subjective:    Patient ID: Sean Fritz, male    DOB: [birth year], 57 y.o.   MRN: [id number]  Tue Apr 16, 2013 2:46 PM EDT  04/16/2013   Bilateral Lumbar L3, L4 medial branch blocks and L 5 dorsal ramus injection under fluoroscopic guidance    HPI First day of injection felt like TENS unit was on back. No significant pain relief reported. The following 4 days experienced numbness in the left thigh to the left knee. This has now resolved. Usual pain returned on July 27. Now is back to his usual moderate to severe back pain  Pain Inventory Average Pain 6 Pain Right Now 4 My pain is sharp, burning, stabbing, tingling and aching  In the last 24 hours, has pain interfered with the following? General activity 7 Relation with others 6 Enjoyment of life 7 What TIME of day is your pain at its worst? evening Sleep (in general) Poor  Pain is worse with: walking, bending and standing Pain improves with: rest, heat/ice and medication Relief from Meds: 6  Mobility walk without assistance how many minutes can you walk? 25  Function disabled: date disabled .  Neuro/Psych bladder control problems numbness tingling  Prior Studies Any changes since last visit?  no  Physicians involved in your care Any changes since last visit?  no   Family History  Problem Relation Age of Onset  . Cancer Mother   . Lupus Sister    History   Social History  . Marital Status: Single    Spouse Name: N/A    Number of Children: N/A  . Years of Education: N/A   Social History Main Topics  . Smoking status: Former Smoker    Quit date: 09/26/1996  . Smokeless tobacco: Never Used  . Alcohol Use: No  . Drug Use: No  . Sexual Activity: None   Other Topics Concern  . None   Social History Narrative  . None   Past Surgical History  Procedure Laterality Date  . Coronary angioplasty with stent placement  12/01/09    2.25 x51mm Taxus Atom Liberte DES stent-diagnal vessel with cutting  balloon arthrotomy.   Past Medical History  Diagnosis Date  . Hypertension   . Depression   . Mixed hyperlipidemia   . Degenerative joint disease   . Myocardial infarction 05/24/11    stress Lexiscan Normal; Low risk scan. EF is 58%. Echo 12/07/09/ normal EF > 55%    BP 155/74  Pulse 78  Resp 14  Ht 5\' 8"  (1.727 m)  Wt 211 lb 9.6 oz (95.981 kg)  BMI 32.18 kg/m2  SpO2 97%    Review of Systems  Constitutional: Positive for unexpected weight change.  Gastrointestinal: Positive for constipation.  Neurological: Positive for numbness.       Tingling  All other systems reviewed and are negative.       Objective:   Physical Exam  Nursing note and vitals reviewed. Constitutional: He is oriented to person, place, and time. He appears well-developed and well-nourished.  HENT:  Head: Normocephalic and atraumatic.  Eyes: Pupils are equal, round, and reactive to light.  Neck: Normal range of motion. Neck supple.  Musculoskeletal:  R ankle dorsiflexor weakness  Neurological: He is alert and oriented to person, place, and time. No sensory deficit. He exhibits normal muscle tone.  Reflex Scores:      Patellar reflexes are 2+ on the right side and 2+ on the left side.      Achilles  reflexes are 2+ on the right side and 2+ on the left side. Psychiatric: He has a normal mood and affect.   Right tibialis anterior 3+/5 Difficulty standing on heels on left side, able to toe walk       Assessment & Plan:  1. Lumbar pain had some radiation down the left lower extremity post injection. I think this relates to positioning during the procedure. He may have some lumbar stenosis which could cause some radiculopathy during extension. Also has some ankle dorsiflexor weakness and it is unclear whether this is related to his back or another injury. Will order MRI lumbar spine to evaluate back as well as left lower extremity complaint.  We'll see in followup and discuss results. Continue  current medications

## 2013-05-17 NOTE — Patient Instructions (Addendum)
MRI lumbar Will review at next visit

## 2013-05-28 ENCOUNTER — Ambulatory Visit
Admission: RE | Admit: 2013-05-28 | Discharge: 2013-05-28 | Disposition: A | Discharge status: Home / Self Care | Payer: Medicare Other | Source: Ambulatory Visit | Attending: Physical Medicine & Rehabilitation | Admitting: Physical Medicine & Rehabilitation

## 2013-05-28 DIAGNOSIS — M21372 Foot drop, left foot: Secondary | ICD-10-CM

## 2013-05-28 DIAGNOSIS — M961 Postlaminectomy syndrome, not elsewhere classified: Secondary | ICD-10-CM

## 2013-05-28 DIAGNOSIS — M47817 Spondylosis without myelopathy or radiculopathy, lumbosacral region: Secondary | ICD-10-CM

## 2013-06-14 ENCOUNTER — Encounter
Payer: Medicare Other | Attending: Physical Medicine and Rehabilitation | Admitting: Physical Medicine and Rehabilitation

## 2013-06-14 ENCOUNTER — Encounter: Payer: Self-pay | Admitting: Physical Medicine and Rehabilitation

## 2013-06-14 VITALS — BP 146/78 | HR 77 | Resp 14 | Ht 68.0 in | Wt 210.2 lb

## 2013-06-14 DIAGNOSIS — M5126 Other intervertebral disc displacement, lumbar region: Secondary | ICD-10-CM | POA: Insufficient documentation

## 2013-06-14 DIAGNOSIS — M51379 Other intervertebral disc degeneration, lumbosacral region without mention of lumbar back pain or lower extremity pain: Secondary | ICD-10-CM | POA: Insufficient documentation

## 2013-06-14 DIAGNOSIS — M5137 Other intervertebral disc degeneration, lumbosacral region: Secondary | ICD-10-CM | POA: Insufficient documentation

## 2013-06-14 DIAGNOSIS — M961 Postlaminectomy syndrome, not elsewhere classified: Secondary | ICD-10-CM

## 2013-06-14 DIAGNOSIS — G894 Chronic pain syndrome: Secondary | ICD-10-CM

## 2013-06-14 MED ORDER — HYDROCODONE-ACETAMINOPHEN 7.5-325 MG PO TABS: 1.0000 | ORAL_TABLET | Freq: Four times a day (QID) | ORAL | Status: DC | PRN

## 2013-06-14 NOTE — Progress Notes (Signed)
Subjective:    Patient ID: Sean Fritz, male    DOB: Feb 13, 1956, 57 y.o.   MRN: [id number]  HPI 57 year old male who had a work-related injury May 10, 2008. He  was treated by Dr. Danne Harbor over at [facility], had  epidural injections which were not particularly helpful, had a second  opinion from Dr. Shelle Iron, was also seen by Dr. Simonne Come who performed L4-  5 diskectomy, in 2009. The patient complains about chronic low back pain which radiates into his legs bilateral in a non radicular pattern, the patient states, that the radiating Sx changing from time to time. The patient also complains about intermittend diffuse numbness and tingling in his LEs.  The problem has gotten a little worse, in the last 6 month, he has received ESI,in July 14, which did not give him any relief. He reports that he is not exercising or walking as much as he did about 6 month ago, because of increasing Sx.  Dr. Doroteo Bradford ordered a MRI of the L-spine, which the patient would like to discuss today.    Seen by Dr. Shelle Iron who did a second opinion, no further  surgical intervention was planned. He was maintained on hydrocodone  5/325 t.i.d. for number of years and over the last year q.i.d. in  combination with Neurontin 300 mg t.i.d.  The patient states that he has had some re-injuries after his original  injury, but none of these were at work since he has been out of work  since May 10, 2008. He has been placed on disability Feb 18, 2011.  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 The patient also complains about intermittend diffuse numbness and tingling in his LE.  The problem has been stable.    Pain Inventory Average Pain 6 Pain Right Now 4 My pain is sharp, burning, stabbing, tingling and  aching  In the last 24 hours, has pain interfered with the following? General activity 7 Relation with others 6 Enjoyment of life 7 What TIME of day is your pain at its worst? evening Sleep (in general) Poor  Pain is worse with: walking, bending, sitting and standing Pain improves with: rest, heat/ice and medication Relief from Meds: 6  Mobility how many minutes can you walk? 20-25 ability to climb steps?  yes do you drive?  yes  Function disabled: date disabled 2012  Neuro/Psych bladder control problems numbness tingling  Prior Studies Any changes since last visit?  no  Physicians involved in your care Any changes since last visit?  no   Family History  Problem Relation Age of Onset  . Cancer Mother   . Lupus Sister    History   Social History  . Marital Status: Single    Spouse Name: N/A    Number of Children: N/A  . Years of Education: N/A   Social History Main Topics  . Smoking status: Former Smoker    Quit date: 09/26/1996  . Smokeless tobacco: Never Used  . Alcohol Use: No  . Drug Use: No  . Sexual Activity: None   Other Topics Concern  . None   Social History Narrative  . None   Past Surgical History  Procedure Laterality Date  . Coronary angioplasty with stent placement  12/01/09  2.25 x77mm Taxus Atom Liberte DES stent-diagnal vessel with cutting balloon arthrotomy.   Past Medical History  Diagnosis Date  . Hypertension   . Depression   . Mixed hyperlipidemia   . Degenerative joint disease   . Myocardial infarction 05/24/11    stress Lexiscan Normal; Low risk scan. EF is 58%. Echo 12/07/09/ normal EF > 55%    BP 146/78  Pulse 77  Resp 14  Ht 5\' 8"  (1.727 m)  Wt 210 lb 3.2 oz (95.346 kg)  BMI 31.97 kg/m2  SpO2 96%   Review of Systems  Constitutional: Positive for unexpected weight change.  Gastrointestinal: Positive for constipation.  Musculoskeletal: Positive for back pain.  All other systems reviewed and are  negative.       Objective:   Physical Exam Constitutional: He is oriented to person, place, and time. He appears well-developed and well-nourished.  HENT:  Head: Normocephalic.  Neck: Neck supple.  Musculoskeletal: He exhibits tenderness.  Neurological: He is alert and oriented to person, place, and time.  Skin: Skin is warm and dry.  Psychiatric: He has a normal mood and affect.  Symmetric normal motor tone is noted throughout. Normal muscle bulk. Muscle testing reveals 5/5 muscle strength of the upper extremity, and 5/5 of the lower extremity. Full range of motion in upper and lower extremities. ROM of spine is restricted. Fine motor movements are normal in both hands.  Sensory is intact and symmetric to light touch, pinprick and proprioception.  DTR in the upper and lower extremity are present and symmetric 2+. No clonus is noted.  Patient arises from chair with mild difficulty. Narrow based gait with normal arm swing bilateral , able to walk on heels and toes . Tandem walk is stable. No pronator drift. Rhomberg negative.  Good finger to nose and heel to shin testing. No tremor, dystaxia or dysmetria noted.         Assessment & Plan:  1. 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 Pt. On disability.  Explained MRI in great detail, went over the images with the patient, discussed treatment options with patient. Suggested referral to a neurosurgeon, for an opinion, whether something surgical could help him.Patient had tried injections, exercising also did not help much at this point. Patient wanted to discuss this with Dr. Doroteo Bradford first. MRI  :  L4-L5: Disc degeneration and desiccation with broad-based  posterior disc bulging. There is a left laminotomy with  decompression of the central canal. Left facet arthrosis and  hypertrophy is present encroaching on the descending left L5 nerve.  There is mild right lateral recess stenosis as well potentially  affecting the descending  right L5 nerve. The foramina appear  adequately patent. Focal right paracentral disc protrusion  superimposed on a broad-based disc bulge.  L5-S1: Shallow broad-based central disc protrusion approaching but  not contacting the descending right S1 nerve. The central canal,  lateral recesses and foramina are adequately patent.  IMPRESSION:  1. Postoperative changes of left L4-L5 laminotomies with  decompression of the central canal. Mild recurrent right greater  than left lateral recess stenosis. Bilateral facet degeneration  with left facet arthritis and a small facet effusion.  2. L5-S1 and L3-L4 mild degenerative changes. Also educated patient about cauda equina syndrome, and that he should go to the ED if he develops those Sx. Spent 25 min with patient face to face. Advised patient to continue with his exercise program, as tolerated. 2. No signs of abuse, last UDS ok,pill counts ok  Follow up with Dr. Doroteo Bradford in 1 month.

## 2013-06-14 NOTE — Patient Instructions (Addendum)
Stay as active as tolerated, continue to walk as pain permits. If you develop loss of bladder and/ or bowl control, go to the ED

## 2013-06-17 ENCOUNTER — Ambulatory Visit: Payer: Medicare Other | Admitting: Physical Medicine & Rehabilitation

## 2013-06-18 ENCOUNTER — Ambulatory Visit (HOSPITAL_BASED_OUTPATIENT_CLINIC_OR_DEPARTMENT_OTHER): Payer: Medicare Other | Admitting: Physical Medicine & Rehabilitation

## 2013-06-18 ENCOUNTER — Encounter: Payer: Medicare Other | Attending: Physical Medicine & Rehabilitation

## 2013-06-18 ENCOUNTER — Encounter: Payer: Self-pay | Admitting: Physical Medicine & Rehabilitation

## 2013-06-18 VITALS — BP 156/75 | HR 70 | Resp 14 | Ht 68.0 in | Wt 211.0 lb

## 2013-06-18 DIAGNOSIS — M961 Postlaminectomy syndrome, not elsewhere classified: Secondary | ICD-10-CM | POA: Insufficient documentation

## 2013-06-18 DIAGNOSIS — M545 Low back pain, unspecified: Secondary | ICD-10-CM | POA: Insufficient documentation

## 2013-06-18 DIAGNOSIS — I252 Old myocardial infarction: Secondary | ICD-10-CM | POA: Insufficient documentation

## 2013-06-18 DIAGNOSIS — I10 Essential (primary) hypertension: Secondary | ICD-10-CM | POA: Insufficient documentation

## 2013-06-18 DIAGNOSIS — M5416 Radiculopathy, lumbar region: Secondary | ICD-10-CM

## 2013-06-18 DIAGNOSIS — IMO0002 Reserved for concepts with insufficient information to code with codable children: Secondary | ICD-10-CM

## 2013-06-18 DIAGNOSIS — E782 Mixed hyperlipidemia: Secondary | ICD-10-CM | POA: Insufficient documentation

## 2013-06-18 MED ORDER — GABAPENTIN 400 MG PO CAPS: 400.0000 mg | ORAL_CAPSULE | Freq: Three times a day (TID) | ORAL | Status: DC | PRN

## 2013-06-18 NOTE — Patient Instructions (Signed)
Referral to Neurosurgery to eval for L4-5 laminotomy/decompression

## 2013-06-18 NOTE — Progress Notes (Signed)
Subjective:    Patient ID: Sean Fritz, male    DOB: 08-31-1956, 57 y.o.   MRN: [id number]  HPI Back pain > Thigh pain.  THigh pain switches side to side, worse on Right side but varies No weakness No loss of bowel or bladder fxn Here to review MRI and treatment options with me. Pain Inventory Average Pain 6 Pain Right Now 4 My pain is sharp, burning, stabbing, tingling and aching  In the last 24 hours, has pain interfered with the following? General activity 7 Relation with others 6 Enjoyment of life 7 What TIME of day is your pain at its worst? evening Sleep (in general) Poor  Pain is worse with: walking, bending, sitting and standing Pain improves with: rest, heat/ice and medication Relief from Meds: 6  Mobility walk without assistance  Function disabled: date disabled 2009  Neuro/Psych bladder control problems numbness tingling  Prior Studies Any changes since last visit?  no 05/28/2013 Clinical Data: Back pain. Left lower extremity weakness. Back  surgery 08/06/2008.  MRI LUMBAR SPINE WITHOUT CONTRAST  Technique: Multiplanar and multiecho pulse sequences of the lumbar  spine were obtained without intravenous contrast.  Comparison: 08/06/2008 intraoperative localization films.  Findings: The numbering convention used for this exam terms L5-S1  as the last full intervertebral disc space above the sacrum.  Marrow signal is within normal limits. Vertebral body height is  preserved. Spinal cord terminates dorsal to the T12-L1 interspace.  Paraspinal soft tissues are within normal limits. There is no  spondylolisthesis. Multilevel disc desiccation is present.  T11-T12 through L1-L2 discs are within normal limits for age.  L2-L3: Mild disc desiccation and minimal bulging without stenosis.  L3-L4: Shallow disc bulging without stenosis. The facet joints  appear within normal limits. Atrophy of the median paraspinal  musculature is likely postsurgical. The ligamentum  flavum appears  intact at this level and this does not appear to be an operative  level.  L4-L5: Disc degeneration and desiccation with broad-based  posterior disc bulging. There is a left laminotomy with  decompression of the central canal. Left facet arthrosis and  hypertrophy is present encroaching on the descending left L5 nerve.  There is mild right lateral recess stenosis as well potentially  affecting the descending right L5 nerve. The foramina appear  adequately patent. Focal right paracentral disc protrusion  superimposed on a broad-based disc bulge.  L5-S1: Shallow broad-based central disc protrusion approaching but  not contacting the descending right S1 nerve. The central canal,  lateral recesses and foramina are adequately patent.  IMPRESSION:  1. Postoperative changes of left L4-L5 laminotomies with  decompression of the central canal. Mild recurrent right greater  than left lateral recess stenosis. Bilateral facet degeneration  with left facet arthritis and a small facet effusion.  2. L5-S1 and L3-L4 mild degenerative changes.  Original Report Authenticated By: Andreas Newport, M.D.  Physicians involved in your care Any changes since last visit?  no   Family History  Problem Relation Age of Onset  . Cancer Mother   . Lupus Sister    History   Social History  . Marital Status: Single    Spouse Name: N/A    Number of Children: N/A  . Years of Education: N/A   Social History Main Topics  . Smoking status: Former Smoker    Quit date: 09/26/1996  . Smokeless tobacco: Never Used  . Alcohol Use: No  . Drug Use: No  . Sexual Activity: None   Other Topics  Concern  . None   Social History Narrative  . None   Past Surgical History  Procedure Laterality Date  . Coronary angioplasty with stent placement  12/01/09    2.25 x30mm Taxus Atom Liberte DES stent-diagnal vessel with cutting balloon arthrotomy.   Past Medical History  Diagnosis Date  . Hypertension    . Depression   . Mixed hyperlipidemia   . Degenerative joint disease   . Myocardial infarction 05/24/11    stress Lexiscan Normal; Low risk scan. EF is 58%. Echo 12/07/09/ normal EF > 55%    BP 156/75  Pulse 70  Resp 14  Ht 5\' 8"  (1.727 m)  Wt 211 lb (95.709 kg)  BMI 32.09 kg/m2  SpO2 99%    Review of Systems  Musculoskeletal: Positive for back pain.  Neurological: Positive for numbness.       Tingling  All other systems reviewed and are negative.       Objective:   Physical Exam  Nursing note and vitals reviewed. Constitutional: He is oriented to person, place, and time. He appears well-developed and well-nourished.  HENT:  Head: Normocephalic and atraumatic.  Eyes: Conjunctivae and EOM are normal. Pupils are equal, round, and reactive to light.  Neurological: He is alert and oriented to person, place, and time. He has normal strength. He displays no atrophy. No sensory deficit. He exhibits normal muscle tone. Coordination and gait normal.  Reflex Scores:      Patellar reflexes are 2+ on the right side and 2+ on the left side.      Achilles reflexes are 2+ on the right side and 2+ on the left side. Psychiatric: He has a normal mood and affect.          Assessment & Plan:  1.  Lumbar spondylosis with lateral recess stenosis causing L>R L5 radicular symptoms. We discuss his treatment options: 1.  Increase Neurontin to 400mg  TID-Done 2.  ESI L5 Transforaminal or Translaminar route- pt declines did not help prior to last surgery 3. Referral to NS.  Prior surgery was related to [facility] case, [facility] no longer accepts his current insurance.  We discussed that his leg pain is most likely to respond to surgical intervention, and that he is likely to continue to have Chronic LBP.

## 2013-07-04 ENCOUNTER — Other Ambulatory Visit: Payer: Self-pay | Admitting: Neurosurgery

## 2013-07-08 ENCOUNTER — Telehealth: Payer: Self-pay | Admitting: *Deleted

## 2013-07-08 NOTE — Telephone Encounter (Signed)
Faxed signed clearance back -okay to stop blood thinners prior to surgery scheduled for 08/07/13 to be done by Dr. Tressie Stalker.

## 2013-07-12 ENCOUNTER — Ambulatory Visit: Payer: Medicare Other | Admitting: Physical Medicine & Rehabilitation

## 2013-07-15 ENCOUNTER — Ambulatory Visit: Payer: Medicare Other | Admitting: Physical Medicine & Rehabilitation

## 2013-07-26 ENCOUNTER — Encounter (HOSPITAL_COMMUNITY): Payer: Self-pay

## 2013-07-29 ENCOUNTER — Encounter (HOSPITAL_COMMUNITY): Payer: Self-pay

## 2013-07-29 ENCOUNTER — Encounter (HOSPITAL_COMMUNITY)
Admission: RE | Admit: 2013-07-29 | Discharge: 2013-07-29 | Disposition: A | Discharge status: Home / Self Care | Payer: Medicare Other | Source: Ambulatory Visit | Attending: Neurosurgery | Admitting: Neurosurgery

## 2013-07-29 DIAGNOSIS — Z01812 Encounter for preprocedural laboratory examination: Secondary | ICD-10-CM | POA: Insufficient documentation

## 2013-07-29 DIAGNOSIS — Z01818 Encounter for other preprocedural examination: Secondary | ICD-10-CM | POA: Insufficient documentation

## 2013-07-29 HISTORY — DX: Malignant (primary) neoplasm, unspecified: C80.1

## 2013-07-29 HISTORY — DX: Drug induced constipation: K59.03

## 2013-07-29 HISTORY — DX: Atherosclerotic heart disease of native coronary artery without angina pectoris: I25.10

## 2013-07-29 HISTORY — DX: Unspecified urinary incontinence: R32

## 2013-07-29 HISTORY — DX: Rosacea, unspecified: L71.9

## 2013-07-29 LAB — CBC
Hemoglobin: 15.8 g/dL (ref 13.0–17.0)
MCV: 86.6 fL (ref 78.0–100.0)
Platelets: 223 10*3/uL (ref 150–400)
RBC: 5.08 MIL/uL (ref 4.22–5.81)
RDW: 12 % (ref 11.5–15.5)
WBC: 11.7 10*3/uL — ABNORMAL HIGH (ref 4.0–10.5)

## 2013-07-29 LAB — BASIC METABOLIC PANEL
CO2: 25 mEq/L (ref 19–32)
Calcium: 9.6 mg/dL (ref 8.4–10.5)
GFR calc Af Amer: >90 mL/min (ref >90)
GFR calc non Af Amer: >90 mL/min (ref >90)
Glucose, Bld: 118 mg/dL — ABNORMAL HIGH (ref 70–99)
Potassium: 4.7 mEq/L (ref 3.5–5.1)
Sodium: 139 mEq/L (ref 135–145)

## 2013-07-29 LAB — TYPE AND SCREEN
ABO/RH(D): O POS
Antibody Screen: NEGATIVE

## 2013-07-29 LAB — SURGICAL PCR SCREEN
MRSA, PCR: NEGATIVE
Staphylococcus aureus: NEGATIVE

## 2013-08-01 ENCOUNTER — Other Ambulatory Visit: Payer: Self-pay

## 2013-08-01 ENCOUNTER — Encounter (HOSPITAL_COMMUNITY): Payer: Self-pay

## 2013-08-01 NOTE — Progress Notes (Signed)
Anesthesia Chart Review:  Patient is a 57 year old male scheduled for L4-5 laminectomies with PLIF on 08/07/13 by Dr. Lovell Sheehan.  History includes obesity, former smoker, HTN, HLD, CAD/MI s/p mid RCA stent '98, DJD, urinary incontinence, rosacea, prostate cancer s/p surgery. PCP is Dr. Tracey Harries. Cardiologist is Dr. Nicki Guadalajara who cleared patient for this procedure.    Nuclear stress test on 05/24/2011 showed normal pattern of perfusion in all regions, post stress EF 58%, global LV systolic function is normal, no significant wall motion abnormalities. Normal myocardial perfusion study. Low risk scan.  Echo on 12/07/09 showed normal LV size, normal LV systolic function with EF greater than 55%, mild tricuspid regurgitation, normal RVSP.  EKG on 03/18/13 showed SB @ 59 bpm.  Preoperative CXR and labs noted.  If no acute changes then I would anticipate that he could proceed as planned.  Velna Ochs [facility] Short Stay Center/Anesthesiology Phone [phone] 08/01/2013 11:11 AM

## 2013-08-06 MED ORDER — CEFAZOLIN SODIUM-DEXTROSE 2-3 GM-% IV SOLR
2.0000 g | INTRAVENOUS | Status: AC
  Administered 2013-08-07 (×2): 2 g via INTRAVENOUS
  Filled 2013-08-06: qty 50

## 2013-08-07 ENCOUNTER — Encounter (HOSPITAL_COMMUNITY)
Admission: RE | Disposition: A | Discharge status: Home / Self Care | Payer: Medicare Other | Source: Ambulatory Visit | Attending: Neurosurgery

## 2013-08-07 ENCOUNTER — Inpatient Hospital Stay (HOSPITAL_COMMUNITY): Payer: Medicare Other

## 2013-08-07 ENCOUNTER — Encounter (HOSPITAL_COMMUNITY): Payer: Medicare Other | Admitting: Vascular Surgery

## 2013-08-07 ENCOUNTER — Inpatient Hospital Stay (HOSPITAL_COMMUNITY)
Admission: RE | Admit: 2013-08-07 | Discharge: 2013-08-10 | DRG: 460 | Disposition: A | Discharge status: Home / Self Care | Payer: Medicare Other | Source: Ambulatory Visit | Attending: Neurosurgery | Admitting: Neurosurgery

## 2013-08-07 ENCOUNTER — Inpatient Hospital Stay (HOSPITAL_COMMUNITY): Payer: Medicare Other | Admitting: Anesthesiology

## 2013-08-07 ENCOUNTER — Encounter (HOSPITAL_COMMUNITY): Payer: Self-pay | Admitting: Surgery

## 2013-08-07 DIAGNOSIS — M5137 Other intervertebral disc degeneration, lumbosacral region: Principal | ICD-10-CM | POA: Diagnosis present

## 2013-08-07 DIAGNOSIS — Z981 Arthrodesis status: Secondary | ICD-10-CM

## 2013-08-07 DIAGNOSIS — Z7982 Long term (current) use of aspirin: Secondary | ICD-10-CM

## 2013-08-07 DIAGNOSIS — Z87891 Personal history of nicotine dependence: Secondary | ICD-10-CM

## 2013-08-07 DIAGNOSIS — F3289 Other specified depressive episodes: Secondary | ICD-10-CM | POA: Diagnosis present

## 2013-08-07 DIAGNOSIS — M51379 Other intervertebral disc degeneration, lumbosacral region without mention of lumbar back pain or lower extremity pain: Principal | ICD-10-CM | POA: Diagnosis present

## 2013-08-07 DIAGNOSIS — E782 Mixed hyperlipidemia: Secondary | ICD-10-CM | POA: Diagnosis present

## 2013-08-07 DIAGNOSIS — M5136 Other intervertebral disc degeneration, lumbar region: Secondary | ICD-10-CM

## 2013-08-07 DIAGNOSIS — I252 Old myocardial infarction: Secondary | ICD-10-CM

## 2013-08-07 DIAGNOSIS — I10 Essential (primary) hypertension: Secondary | ICD-10-CM | POA: Diagnosis present

## 2013-08-07 DIAGNOSIS — K5909 Other constipation: Secondary | ICD-10-CM | POA: Diagnosis present

## 2013-08-07 DIAGNOSIS — F329 Major depressive disorder, single episode, unspecified: Secondary | ICD-10-CM | POA: Diagnosis present

## 2013-08-07 DIAGNOSIS — Z9861 Coronary angioplasty status: Secondary | ICD-10-CM

## 2013-08-07 DIAGNOSIS — I2510 Atherosclerotic heart disease of native coronary artery without angina pectoris: Secondary | ICD-10-CM | POA: Diagnosis present

## 2013-08-07 SURGERY — POSTERIOR LUMBAR FUSION 1 LEVEL
Anesthesia: General | Site: Spine Lumbar | Wound class: Clean

## 2013-08-07 MED ORDER — GLYCOPYRROLATE 0.2 MG/ML IJ SOLN
INTRAMUSCULAR | Status: DC | PRN
  Administered 2013-08-07: 0.1 mg via INTRAVENOUS
  Administered 2013-08-07: 0.6 mg via INTRAVENOUS
  Administered 2013-08-07: 0.1 mg via INTRAVENOUS

## 2013-08-07 MED ORDER — HYDROMORPHONE HCL PF 1 MG/ML IJ SOLN
INTRAMUSCULAR | Status: AC
  Filled 2013-08-07: qty 1

## 2013-08-07 MED ORDER — ATORVASTATIN CALCIUM 80 MG PO TABS
80.0000 mg | ORAL_TABLET | Freq: Every day | ORAL | Status: DC
  Administered 2013-08-08 – 2013-08-10 (×3): 80 mg via ORAL
  Filled 2013-08-07 (×3): qty 1

## 2013-08-07 MED ORDER — ZOLPIDEM TARTRATE 5 MG PO TABS: 5.0000 mg | ORAL_TABLET | Freq: Every evening | ORAL | Status: DC | PRN

## 2013-08-07 MED ORDER — NEOSTIGMINE METHYLSULFATE 1 MG/ML IJ SOLN
INTRAMUSCULAR | Status: DC | PRN
  Administered 2013-08-07: 4 mg via INTRAVENOUS

## 2013-08-07 MED ORDER — ACETAMINOPHEN 650 MG RE SUPP: 650.0000 mg | RECTAL | Status: DC | PRN

## 2013-08-07 MED ORDER — FENTANYL CITRATE 0.05 MG/ML IJ SOLN: 50.0000 ug | Freq: Once | INTRAMUSCULAR | Status: DC

## 2013-08-07 MED ORDER — FENTANYL CITRATE 0.05 MG/ML IJ SOLN
INTRAMUSCULAR | Status: DC | PRN
Start: 2013-08-07 — End: 2013-08-07
  Administered 2013-08-07 (×8): 50 ug via INTRAVENOUS

## 2013-08-07 MED ORDER — DIAZEPAM 5 MG PO TABS
5.0000 mg | ORAL_TABLET | Freq: Four times a day (QID) | ORAL | Status: DC | PRN
  Administered 2013-08-07 – 2013-08-09 (×6): 5 mg via ORAL
  Filled 2013-08-07 (×6): qty 1

## 2013-08-07 MED ORDER — MENTHOL 3 MG MT LOZG: 1.0000 | LOZENGE | OROMUCOSAL | Status: DC | PRN

## 2013-08-07 MED ORDER — MIDAZOLAM HCL 5 MG/5ML IJ SOLN
INTRAMUSCULAR | Status: DC | PRN
  Administered 2013-08-07 (×2): 1 mg via INTRAVENOUS

## 2013-08-07 MED ORDER — EPHEDRINE SULFATE 50 MG/ML IJ SOLN
INTRAMUSCULAR | Status: DC | PRN
  Administered 2013-08-07: 10 mg via INTRAVENOUS
  Administered 2013-08-07: 5 mg via INTRAVENOUS
  Administered 2013-08-07: 10 mg via INTRAVENOUS

## 2013-08-07 MED ORDER — PHENYLEPHRINE HCL 10 MG/ML IJ SOLN
10.0000 mg | INTRAVENOUS | Status: DC | PRN
  Administered 2013-08-07: 25 ug/min via INTRAVENOUS

## 2013-08-07 MED ORDER — MORPHINE SULFATE (PF) 1 MG/ML IV SOLN
INTRAVENOUS | Status: DC
  Administered 2013-08-07: 1.5 mg via INTRAVENOUS
  Administered 2013-08-08: 6.5 mg via INTRAVENOUS
  Administered 2013-08-08: 10.5 mg via INTRAVENOUS
  Administered 2013-08-08: 13.5 mg via INTRAVENOUS
  Filled 2013-08-07 (×2): qty 25

## 2013-08-07 MED ORDER — HYDROCODONE-ACETAMINOPHEN 5-325 MG PO TABS: 1.0000 | ORAL_TABLET | ORAL | Status: DC | PRN

## 2013-08-07 MED ORDER — CEFAZOLIN SODIUM-DEXTROSE 2-3 GM-% IV SOLR
2.0000 g | Freq: Three times a day (TID) | INTRAVENOUS | Status: AC
  Administered 2013-08-07 – 2013-08-08 (×2): 2 g via INTRAVENOUS
  Filled 2013-08-07 (×2): qty 50

## 2013-08-07 MED ORDER — NALOXONE HCL 0.4 MG/ML IJ SOLN: 0.4000 mg | INTRAMUSCULAR | Status: DC | PRN

## 2013-08-07 MED ORDER — PHENYLEPHRINE HCL 10 MG/ML IJ SOLN
INTRAMUSCULAR | Status: DC | PRN
  Administered 2013-08-07 (×3): 80 ug via INTRAVENOUS
  Administered 2013-08-07: 120 ug via INTRAVENOUS

## 2013-08-07 MED ORDER — THROMBIN 20000 UNITS EX SOLR
CUTANEOUS | Status: DC | PRN
  Administered 2013-08-07 (×2): via TOPICAL

## 2013-08-07 MED ORDER — 0.9 % SODIUM CHLORIDE (POUR BTL) OPTIME
TOPICAL | Status: DC | PRN
  Administered 2013-08-07: 1000 mL

## 2013-08-07 MED ORDER — PHENOL 1.4 % MT LIQD: 1.0000 | OROMUCOSAL | Status: DC | PRN

## 2013-08-07 MED ORDER — BUPROPION HCL ER (XL) 150 MG PO TB24
150.0000 mg | ORAL_TABLET | Freq: Three times a day (TID) | ORAL | Status: DC
  Administered 2013-08-07 – 2013-08-10 (×8): 150 mg via ORAL
  Filled 2013-08-07 (×12): qty 1

## 2013-08-07 MED ORDER — ONDANSETRON HCL 4 MG/2ML IJ SOLN
INTRAMUSCULAR | Status: DC | PRN
  Administered 2013-08-07: 4 mg via INTRAVENOUS

## 2013-08-07 MED ORDER — ROCURONIUM BROMIDE 100 MG/10ML IV SOLN
INTRAVENOUS | Status: DC | PRN
  Administered 2013-08-07: 50 mg via INTRAVENOUS

## 2013-08-07 MED ORDER — LIDOCAINE HCL (CARDIAC) 20 MG/ML IV SOLN
INTRAVENOUS | Status: DC | PRN
  Administered 2013-08-07: 100 mg via INTRAVENOUS

## 2013-08-07 MED ORDER — PROMETHAZINE HCL 25 MG/ML IJ SOLN: 6.2500 mg | INTRAMUSCULAR | Status: DC | PRN

## 2013-08-07 MED ORDER — OXYCODONE-ACETAMINOPHEN 5-325 MG PO TABS
1.0000 | ORAL_TABLET | ORAL | Status: DC | PRN
  Administered 2013-08-07 – 2013-08-10 (×11): 2 via ORAL
  Filled 2013-08-07 (×11): qty 2

## 2013-08-07 MED ORDER — PROPOFOL 10 MG/ML IV BOLUS
INTRAVENOUS | Status: DC | PRN
  Administered 2013-08-07: 200 mg via INTRAVENOUS

## 2013-08-07 MED ORDER — BACITRACIN ZINC 500 UNIT/GM EX OINT
TOPICAL_OINTMENT | CUTANEOUS | Status: DC | PRN
  Administered 2013-08-07: 1 via TOPICAL

## 2013-08-07 MED ORDER — HYDROMORPHONE HCL PF 1 MG/ML IJ SOLN
0.2500 mg | INTRAMUSCULAR | Status: DC | PRN
  Administered 2013-08-07 (×4): 0.5 mg via INTRAVENOUS

## 2013-08-07 MED ORDER — ONDANSETRON HCL 4 MG/2ML IJ SOLN: 4.0000 mg | Freq: Four times a day (QID) | INTRAMUSCULAR | Status: DC | PRN

## 2013-08-07 MED ORDER — DIPHENHYDRAMINE HCL 50 MG/ML IJ SOLN: 12.5000 mg | Freq: Four times a day (QID) | INTRAMUSCULAR | Status: DC | PRN

## 2013-08-07 MED ORDER — RAMIPRIL 10 MG PO TABS: 10.0000 mg | ORAL_TABLET | Freq: Every day | ORAL | Status: DC

## 2013-08-07 MED ORDER — RAMIPRIL 10 MG PO CAPS
10.0000 mg | ORAL_CAPSULE | Freq: Every day | ORAL | Status: DC
  Administered 2013-08-08 – 2013-08-10 (×2): 10 mg via ORAL
  Filled 2013-08-07 (×3): qty 1

## 2013-08-07 MED ORDER — SODIUM CHLORIDE 0.9 % IJ SOLN: 9.0000 mL | INTRAMUSCULAR | Status: DC | PRN

## 2013-08-07 MED ORDER — LACTATED RINGERS IV SOLN
INTRAVENOUS | Status: DC | PRN
  Administered 2013-08-07 (×2): via INTRAVENOUS

## 2013-08-07 MED ORDER — ACETAMINOPHEN 325 MG PO TABS
650.0000 mg | ORAL_TABLET | ORAL | Status: DC | PRN
  Administered 2013-08-08 – 2013-08-09 (×2): 650 mg via ORAL
  Filled 2013-08-07 (×2): qty 2

## 2013-08-07 MED ORDER — GABAPENTIN 400 MG PO CAPS
400.0000 mg | ORAL_CAPSULE | Freq: Three times a day (TID) | ORAL | Status: DC
  Administered 2013-08-07 – 2013-08-10 (×8): 400 mg via ORAL
  Filled 2013-08-07 (×12): qty 1

## 2013-08-07 MED ORDER — DIPHENHYDRAMINE HCL 12.5 MG/5ML PO ELIX: 12.5000 mg | ORAL_SOLUTION | Freq: Four times a day (QID) | ORAL | Status: DC | PRN

## 2013-08-07 MED ORDER — ARTIFICIAL TEARS OP OINT
TOPICAL_OINTMENT | OPHTHALMIC | Status: DC | PRN
  Administered 2013-08-07: 1 via OPHTHALMIC

## 2013-08-07 MED ORDER — CEFAZOLIN SODIUM-DEXTROSE 2-3 GM-% IV SOLR
INTRAVENOUS | Status: AC
  Filled 2013-08-07: qty 50

## 2013-08-07 MED ORDER — ALBUMIN HUMAN 5 % IV SOLN
INTRAVENOUS | Status: DC | PRN
  Administered 2013-08-07: 09:00:00 via INTRAVENOUS

## 2013-08-07 MED ORDER — METOPROLOL SUCCINATE ER 25 MG PO TB24
25.0000 mg | ORAL_TABLET | Freq: Every day | ORAL | Status: DC
  Administered 2013-08-08 – 2013-08-10 (×2): 25 mg via ORAL
  Filled 2013-08-07 (×3): qty 1

## 2013-08-07 MED ORDER — OXYCODONE HCL 5 MG/5ML PO SOLN: 5.0000 mg | Freq: Once | ORAL | Status: DC | PRN

## 2013-08-07 MED ORDER — MORPHINE SULFATE (PF) 1 MG/ML IV SOLN
INTRAVENOUS | Status: AC
  Filled 2013-08-07: qty 25

## 2013-08-07 MED ORDER — DOCUSATE SODIUM 100 MG PO CAPS
100.0000 mg | ORAL_CAPSULE | Freq: Two times a day (BID) | ORAL | Status: DC
  Administered 2013-08-07 – 2013-08-10 (×6): 100 mg via ORAL
  Filled 2013-08-07 (×5): qty 1

## 2013-08-07 MED ORDER — VECURONIUM BROMIDE 10 MG IV SOLR
INTRAVENOUS | Status: DC | PRN
  Administered 2013-08-07: 2 mg via INTRAVENOUS
  Administered 2013-08-07: 1 mg via INTRAVENOUS
  Administered 2013-08-07 (×2): 2 mg via INTRAVENOUS

## 2013-08-07 MED ORDER — SODIUM CHLORIDE 0.9 % IR SOLN
Status: DC | PRN
  Administered 2013-08-07: 10:00:00

## 2013-08-07 MED ORDER — NIACIN ER (ANTIHYPERLIPIDEMIC) 500 MG PO TBCR
1000.0000 mg | EXTENDED_RELEASE_TABLET | Freq: Every day | ORAL | Status: DC
  Administered 2013-08-07: 1000 mg via ORAL
  Filled 2013-08-07 (×2): qty 2

## 2013-08-07 MED ORDER — ONDANSETRON HCL 4 MG/2ML IJ SOLN
4.0000 mg | INTRAMUSCULAR | Status: DC | PRN
  Administered 2013-08-07 – 2013-08-09 (×2): 4 mg via INTRAVENOUS
  Filled 2013-08-07 (×2): qty 2

## 2013-08-07 MED ORDER — LACTATED RINGERS IV SOLN
INTRAVENOUS | Status: DC
  Administered 2013-08-08: 07:00:00 via INTRAVENOUS

## 2013-08-07 MED ORDER — ISOSORBIDE MONONITRATE ER 60 MG PO TB24
60.0000 mg | ORAL_TABLET | Freq: Every day | ORAL | Status: DC
  Administered 2013-08-08 – 2013-08-10 (×3): 60 mg via ORAL
  Filled 2013-08-07 (×3): qty 1

## 2013-08-07 MED ORDER — OXYCODONE HCL 5 MG PO TABS: 5.0000 mg | ORAL_TABLET | Freq: Once | ORAL | Status: DC | PRN

## 2013-08-07 MED ORDER — BUPIVACAINE-EPINEPHRINE PF 0.5-1:200000 % IJ SOLN
INTRAMUSCULAR | Status: DC | PRN
  Administered 2013-08-07: 10 mL

## 2013-08-07 MED ORDER — AMLODIPINE BESYLATE 10 MG PO TABS
10.0000 mg | ORAL_TABLET | Freq: Every day | ORAL | Status: DC
  Administered 2013-08-08 – 2013-08-10 (×2): 10 mg via ORAL
  Filled 2013-08-07 (×3): qty 1

## 2013-08-07 MED ORDER — ALUM & MAG HYDROXIDE-SIMETH 200-200-20 MG/5ML PO SUSP: 30.0000 mL | Freq: Four times a day (QID) | ORAL | Status: DC | PRN

## 2013-08-07 MED ORDER — MIDAZOLAM HCL 2 MG/2ML IJ SOLN: 1.0000 mg | INTRAMUSCULAR | Status: DC | PRN

## 2013-08-07 MED ORDER — NITROGLYCERIN 0.4 MG SL SUBL: 0.4000 mg | SUBLINGUAL_TABLET | SUBLINGUAL | Status: DC | PRN

## 2013-08-07 SURGICAL SUPPLY — 72 items
APL SRG 60D 8 XTD TIP BNDBL (TIP) ×1
BAG DECANTER FOR FLEXI CONT (MISCELLANEOUS) ×2 IMPLANT
BENZOIN TINCTURE PRP APPL 2/3 (GAUZE/BANDAGES/DRESSINGS) ×2 IMPLANT
BLADE SURG ROTATE 9660 (MISCELLANEOUS) ×2 IMPLANT
BRUSH SCRUB EZ PLAIN DRY (MISCELLANEOUS) ×2 IMPLANT
BUR ACORN 6.0 (BURR) ×2 IMPLANT
BUR MATCHSTICK NEURO 3.0 LAGG (BURR) ×2 IMPLANT
CANISTER SUCT 3000ML (MISCELLANEOUS) ×2 IMPLANT
CAP REVERE LOCKING (Cap) ×8 IMPLANT
CONT SPEC 4OZ CLIKSEAL STRL BL (MISCELLANEOUS) ×2 IMPLANT
COVER BACK TABLE 24X17X13 BIG (DRAPES) IMPLANT
COVER TABLE BACK 60X90 (DRAPES) ×2 IMPLANT
DRAPE C-ARM 42X72 X-RAY (DRAPES) ×4 IMPLANT
DRAPE LAPAROTOMY 100X72X124 (DRAPES) ×2 IMPLANT
DRAPE POUCH INSTRU U-SHP 10X18 (DRAPES) ×2 IMPLANT
DRAPE PROXIMA HALF (DRAPES) IMPLANT
DRAPE SURG 17X23 STRL (DRAPES) ×8 IMPLANT
DURASEAL APPLICATOR TIP (TIP) ×2 IMPLANT
DURASEAL SPINE SEALANT 3ML (MISCELLANEOUS) ×2 IMPLANT
ELECT BLADE 4.0 EZ CLEAN MEGAD (MISCELLANEOUS) ×2
ELECT REM PT RETURN 9FT ADLT (ELECTROSURGICAL) ×2
ELECTRODE BLDE 4.0 EZ CLN MEGD (MISCELLANEOUS) ×1 IMPLANT
ELECTRODE REM PT RTRN 9FT ADLT (ELECTROSURGICAL) ×1 IMPLANT
GAUZE SPONGE 4X4 16PLY XRAY LF (GAUZE/BANDAGES/DRESSINGS) ×2 IMPLANT
GLOVE BIO SURGEON STRL SZ8.5 (GLOVE) ×4 IMPLANT
GLOVE BIOGEL PI IND STRL 7.0 (GLOVE) ×3 IMPLANT
GLOVE BIOGEL PI IND STRL 7.5 (GLOVE) ×1 IMPLANT
GLOVE BIOGEL PI IND STRL 8 (GLOVE) ×1 IMPLANT
GLOVE BIOGEL PI INDICATOR 7.0 (GLOVE) ×3
GLOVE BIOGEL PI INDICATOR 7.5 (GLOVE) ×1
GLOVE BIOGEL PI INDICATOR 8 (GLOVE) ×1
GLOVE ECLIPSE 7.5 STRL STRAW (GLOVE) ×2 IMPLANT
GLOVE EXAM NITRILE LRG STRL (GLOVE) IMPLANT
GLOVE EXAM NITRILE MD LF STRL (GLOVE) IMPLANT
GLOVE EXAM NITRILE XL STR (GLOVE) IMPLANT
GLOVE EXAM NITRILE XS STR PU (GLOVE) IMPLANT
GLOVE SS BIOGEL STRL SZ 8 (GLOVE) ×2 IMPLANT
GLOVE SUPERSENSE BIOGEL SZ 8 (GLOVE) ×2
GLOVE SURG SS PI 7.0 STRL IVOR (GLOVE) ×10 IMPLANT
GOWN BRE IMP SLV AUR LG STRL (GOWN DISPOSABLE) IMPLANT
GOWN BRE IMP SLV AUR XL STRL (GOWN DISPOSABLE) ×8 IMPLANT
GOWN STRL REIN 2XL LVL4 (GOWN DISPOSABLE) IMPLANT
KIT BASIN OR (CUSTOM PROCEDURE TRAY) ×2 IMPLANT
KIT ROOM TURNOVER OR (KITS) ×2 IMPLANT
NEEDLE HYPO 21X1.5 SAFETY (NEEDLE) IMPLANT
NEEDLE HYPO 22GX1.5 SAFETY (NEEDLE) ×2 IMPLANT
NS IRRIG 1000ML POUR BTL (IV SOLUTION) ×2 IMPLANT
PACK FOAM VITOSS 10CC (Orthopedic Implant) ×2 IMPLANT
PACK LAMINECTOMY NEURO (CUSTOM PROCEDURE TRAY) ×2 IMPLANT
PAD ARMBOARD 7.5X6 YLW CONV (MISCELLANEOUS) ×10 IMPLANT
PATTIES SURGICAL .5 X1 (DISPOSABLE) IMPLANT
PUTTY 10ML ACTIFUSE ABX (Putty) ×4 IMPLANT
ROD REVERE 6.35 40MM (Rod) ×4 IMPLANT
SCREW REVERE 6.35 75X55MM (Screw) ×8 IMPLANT
SPACER SUSTAIN O 10X26 15MM (Spacer) ×4 IMPLANT
SPONGE GAUZE 4X4 12PLY (GAUZE/BANDAGES/DRESSINGS) ×2 IMPLANT
SPONGE LAP 4X18 X RAY DECT (DISPOSABLE) IMPLANT
SPONGE NEURO XRAY DETECT 1X3 (DISPOSABLE) IMPLANT
SPONGE SURGIFOAM ABS GEL 100 (HEMOSTASIS) ×4 IMPLANT
STRIP CLOSURE SKIN 1/2X4 (GAUZE/BANDAGES/DRESSINGS) ×2 IMPLANT
SUT PROLENE 6 0 BV (SUTURE) ×6 IMPLANT
SUT VIC AB 1 CT1 18XBRD ANBCTR (SUTURE) ×2 IMPLANT
SUT VIC AB 1 CT1 8-18 (SUTURE) ×4
SUT VIC AB 2-0 CP2 18 (SUTURE) ×2 IMPLANT
SYR 20CC LL (SYRINGE) IMPLANT
SYR 20ML ECCENTRIC (SYRINGE) ×2 IMPLANT
TAPE CLOTH SURG 4X10 WHT LF (GAUZE/BANDAGES/DRESSINGS) ×2 IMPLANT
TOWEL OR 17X24 6PK STRL BLUE (TOWEL DISPOSABLE) ×2 IMPLANT
TOWEL OR 17X26 10 PK STRL BLUE (TOWEL DISPOSABLE) ×2 IMPLANT
TRAY FOLEY CATH 14FRSI W/METER (CATHETERS) IMPLANT
TUBE CONNECTING 12X1/4 (SUCTIONS) ×2 IMPLANT
WATER STERILE IRR 1000ML POUR (IV SOLUTION) ×2 IMPLANT

## 2013-08-07 NOTE — Preoperative (Signed)
Beta Blockers   Reason not to administer Beta Blockers:Not Applicable 

## 2013-08-07 NOTE — Op Note (Signed)
Brief history: The patient is a 57 year old white male who has undergone a previous L4-5 laminectomy and discectomy by another physician years ago. He has had chronic persistent back pain. He has failed extensive nonsurgical management. He was worked up with a lumbar MRI which demonstrated this degeneration at L4-5. I discussed the various treatment options with the patient including surgery. The patient has weighed the risks, benefits, and alternatives surgery and decided proceed with an L4-5 redo laminectomy, decompression and fusion.  Preoperative diagnosis: L4-5 Degenerative disc disease, spinal stenosis compressing both the L4 and the L5 nerve roots; lumbago; lumbar radiculopathy  Postoperative diagnosis: The same  Procedure: Bilateral redo L4 Laminotomy/foraminotomies to decompress the bilateral L4 and L5 nerve roots(the work required to do this was in addition to the work required to do the posterior lumbar interbody fusion because of the patient's spinal stenosis, previous surgery, facet arthropathy. Etc. requiring a wide decompression of the nerve roots.); L4-5 posterior lumbar interbody fusion with local morselized autograft bone and Actifusebone graft extender; insertion of interbody prosthesis at L4-5 (globus peek interbody prosthesis); posterior nonsegmental instrumentation from L4 to L5 with globus titanium pedicle screws and rods; posterior lateral arthrodesis at L4-5 with local morselized autograft bone and Vitoss bone graft extender.  Surgeon: Dr. Delma Officer  Asst.: Dr. Shirlean Kelly  Anesthesia: Gen. endotracheal  Estimated blood loss: 200 cc  Drains: None  Complications: None  Description of procedure: The patient was brought to the operating room by the anesthesia team. General endotracheal anesthesia was induced. The patient was turned to the prone position on the Wilson frame. The patient's lumbosacral region was then prepared with Betadine scrub and Betadine solution.  Sterile drapes were applied.  I then injected the area to be incised with Marcaine with epinephrine solution. I then used the scalpel to make a linear midline incision over the L4-5 interspace, incising through the patient's old surgical scar.. I then used electrocautery to perform a bilateral subperiosteal dissection exposing the spinous process and lamina of L3-L5. We then obtained intraoperative radiograph to confirm our location. We then inserted the Verstrac retractor to provide exposure.  I began the decompression by using the high speed drill to perform bilateral redo laminotomies at L4. We then used the Kerrison punches to widen the laminotomy and removed the remaining ligamentum flavum at L4-5. There was quite a bit of epidural scar tissue. It appeared that the fat had been previously placed in the epidural space. We used the Kerrison punches to remove the medial facets at L4-5, the facets were quite arthritic. We performed wide foraminotomies about the bilateral L4 and L5 nerve roots completing the decompression.  We now turned our attention to the posterior lumbar interbody fusion. I used a scalpel to incise the intervertebral disc at L4-5. I then performed a partial intervertebral discectomy at L4-5 using the pituitary forceps. We prepared the vertebral endplates at L4-5 for the fusion by removing the soft tissues with the curettes. We then used the trial spacers to pick the appropriate sized interbody prosthesis. We prefilled his prosthesis with a combination of local morselized autograft bone that we obtained during the decompression as well as Actifuse bone graft extender. We inserted the prefilled prosthesis into the interspace at L4-5. There was a good snug fit of the prosthesis in the interspace. We then filled and the remainder of the intervertebral disc space with local morselized autograft bone and Actifuse. This completed the posterior lumbar interbody arthrodesis.  We now turned  attention to the  instrumentation. Under fluoroscopic guidance we cannulated the bilateral L4 and L5 pedicles with the bone probe. We then removed the bone probe. We then tapped the pedicle with a 6.5 millimeter tap. We then removed the tap. We probed inside the tapped pedicle with a ball probe to rule out cortical breaches. We then inserted a 7.5 x 55 millimeter pedicle screw into the L4 and L5 pedicles bilaterally under fluoroscopic guidance. We then palpated along the medial aspect of the pedicles to rule out cortical breaches. There were none. The nerve roots were not injured. We then connected the unilateral pedicle screws with a lordotic rod. We compressed the construct and secured the rod in place with the caps. We then tightened the caps appropriately. This completed the instrumentation from L4-5.  We now turned our attention to the posterior lateral arthrodesis at L4-5. We used the high-speed drill to decorticate the remainder of the facets, pars, transverse process at L4-5. We then applied a combination of local morselized autograft bone and Vitoss bone graft extender over these decorticated posterior lateral structures. This completed the posterior lateral arthrodesis.  We then obtained hemostasis using bipolar electrocautery. We irrigated the wound out with bacitracin solution. We inspected the thecal sac and nerve roots and noted they were well decompressed. We then removed the retractor.  We reapproximated patient's thoracolumbar fascia with interrupted #1 Vicryl suture. We reapproximated patient's subcutaneous tissue with interrupted 2-0 Vicryl suture. The reapproximated patient's skin with Steri-Strips and benzoin. The wound was then coated with bacitracin ointment. A sterile dressing was applied. The drapes were removed. The patient was subsequently returned to the supine position where they were extubated by the anesthesia team. He was then transported to the post anesthesia care unit in stable  condition. All sponge instrument and needle counts were reportedly correct at the end of this case.

## 2013-08-07 NOTE — Plan of Care (Signed)
Problem: Phase I Progression Outcomes Goal: Pain controlled with appropriate interventions Outcome: Progressing Utilizing multiple means of providing pain control for patient. Goal: Log roll for position change Outcome: Progressing Patient instructed to logroll for postion changes.

## 2013-08-07 NOTE — Anesthesia Postprocedure Evaluation (Signed)
  Anesthesia Post-op Note  Patient: Sean Fritz  Procedure(s) Performed: Procedure(s) with comments: LUMBAR FOUR-FIVE LAMINECTOMIES WITH POSTERIOR LUMBAR INTERBODY FUSION AND INTERBODY PROSTHESIS WITH POSTERIOR LATERAL ARTHRODESIS AND POSTERIOR NONSEGMENTAL INSTRUMENTATION. (N/A) - BILATERAL  Patient Location: PACU  Anesthesia Type:General  Level of Consciousness: awake  Airway and Oxygen Therapy: Patient Spontanous Breathing  Post-op Pain: mild  Post-op Assessment: Post-op Vital signs reviewed, Patient's Cardiovascular Status Stable, Respiratory Function Stable, Patent Airway, No signs of Nausea or vomiting and Pain level controlled  Post-op Vital Signs: Reviewed and stable  Complications: No apparent anesthesia complications

## 2013-08-07 NOTE — Anesthesia Preprocedure Evaluation (Signed)
Anesthesia Evaluation  Patient identified by MRN, date of birth, ID band Patient awake    Reviewed: Allergy & Precautions, H&P , NPO status , Patient's Chart, lab work & pertinent test results  Airway Mallampati: II TM Distance: >3 FB Neck ROM: Full    Dental   Pulmonary former smoker,  breath sounds clear to auscultation        Cardiovascular hypertension, + CAD and + Past MI Rhythm:Regular Rate:Normal     Neuro/Psych Depression    GI/Hepatic   Endo/Other    Renal/GU      Musculoskeletal   Abdominal (+) + obese,   Peds  Hematology   Anesthesia Other Findings   Reproductive/Obstetrics                           Anesthesia Physical Anesthesia Plan  ASA: III  Anesthesia Plan: General   Post-op Pain Management:    Induction: Intravenous  Airway Management Planned: Oral ETT  Additional Equipment:   Intra-op Plan:   Post-operative Plan: Extubation in OR  Informed Consent: I have reviewed the patients History and Physical, chart, labs and discussed the procedure including the risks, benefits and alternatives for the proposed anesthesia with the patient or authorized representative who has indicated his/her understanding and acceptance.     Plan Discussed with: CRNA and Surgeon  Anesthesia Plan Comments:         Anesthesia Quick Evaluation

## 2013-08-07 NOTE — H&P (Signed)
Subjective: The patient is a 57 year old white male who was previously undergone an L4-5 laminectomy and discectomy by another physician. He has had chronic back pain. He has failed nonsurgical management. He was worked up with a lumbar MRI which demonstrated severe disc degeneration at L4-5 with foraminal stenosis. I have discussed the various treatment options with the patient including surgery. The patient has weighed the risks, benefits, and alternatives surgery and decided to proceed with a lumbar decompression, each patient, and fusion.   Past Medical History  Diagnosis Date  . Hypertension   . Depression   . Mixed hyperlipidemia   . Degenerative joint disease   . Cancer     Prostate   . Myocardial infarction 1998     stress Lexiscan Normal; Low risk scan. EF is 58%. Echo 12/07/09/ normal EF > 55%   . Inability to control urination     d/t prostate surgery; pt wears pad  . Constipation due to pain medication   . Rosacea     on nose  . Coronary artery disease     Past Surgical History  Procedure Laterality Date  . Coronary angioplasty with stent placement  1998 & 12/01/09    2.25 x8mm Taxus Atom Liberte DES stent-diagnal vessel with cutting balloon arthrotomy.  . Back surgery  Nov 2009  . Prostate surgery    . Elbow surgery Left July 2010  . Tonsillectomy      Allergies  Allergen Reactions  . No Known Drug Allergy     History  Substance Use Topics  . Smoking status: Former Smoker    Quit date: 09/26/1996  . Smokeless tobacco: Never Used  . Alcohol Use: No    Family History  Problem Relation Age of Onset  . Cancer Mother   . Lupus Sister    Prior to Admission medications   Medication Sig Start Date End Date Taking? Authorizing Provider  amLODipine (NORVASC) 10 MG tablet Take 1 tablet (10 mg total) by mouth daily. 04/02/13  Yes Lennette Bihari, MD  aspirin 81 MG tablet Take 81 mg by mouth daily.    Yes Historical Provider, MD  atorvastatin (LIPITOR) 80 MG tablet Take  1 tablet (80 mg total) by mouth daily. 03/07/13  Yes Lennette Bihari, MD  buPROPion (WELLBUTRIN XL) 150 MG 24 hr tablet Take 150 mg by mouth 3 (three) times daily.   Yes Historical Provider, MD  chlorhexidine (PERIDEX) 0.12 % solution Use as directed 15 mLs in the mouth or throat daily.  10/29/12  Yes Historical Provider, MD  clopidogrel (PLAVIX) 75 MG tablet Take 1 tablet (75 mg total) by mouth daily. 03/07/13  Yes Lennette Bihari, MD  diphenhydramine-acetaminophen (TYLENOL PM) 25-500 MG TABS Take 2 tablets by mouth at bedtime.   Yes Historical Provider, MD  EPIPEN 2-PAK 0.3 MG/0.3ML SOAJ injection  05/03/13  Yes Historical Provider, MD  gabapentin (NEURONTIN) 400 MG capsule Take 1 capsule (400 mg total) by mouth 3 (three) times daily as needed. 06/18/13  Yes Erick Colace, MD  HYDROcodone-acetaminophen (NORCO) 7.5-325 MG per tablet Take 1 tablet by mouth every 6 (six) hours as needed for pain. Must last 30 days. 06/14/13  Yes Clydie Braun Prueter, PA-C  isosorbide mononitrate (IMDUR) 60 MG 24 hr tablet Take 1 tablet (60 mg total) by mouth daily. 03/07/13  Yes Lennette Bihari, MD  metoprolol succinate (TOPROL-XL) 50 MG 24 hr tablet Take 1 &1/2 tablets daily 03/20/13  Yes Lennette Bihari, MD  niacin (NIASPAN)  500 MG CR tablet Take 1,000 mg by mouth at bedtime.    Yes Historical Provider, MD  ramipril (ALTACE) 10 MG tablet Take 1 tablet (10 mg total) by mouth daily. 03/07/13  Yes Lennette Bihari, MD  tiZANidine (ZANAFLEX) 4 MG tablet Take 0.5 tablets (2 mg total) by mouth every 8 (eight) hours as needed. 01/17/13  Yes Erick Colace, MD  Vitamin D, Ergocalciferol, (DRISDOL) 50000 UNITS CAPS capsule Take 50,000 Units by mouth every 7 (seven) days.   Yes Historical Provider, MD  nitroGLYCERIN (NITROSTAT) 0.4 MG SL tablet Place 0.4 mg under the tongue as needed.    Historical Provider, MD     Review of Systems  Positive ROS: As above  All other systems have been reviewed and were otherwise negative with the  exception of those mentioned in the HPI and as above.  Objective: Vital signs in last 24 hours: Temp:  [97.3 F (36.3 C)] 97.3 F (36.3 C) (11/12 0644) Pulse Rate:  [63] 63 (11/12 0644) BP: (114)/(43) 114/43 mmHg (11/12 0644) SpO2:  [98 %] 98 % (11/12 0644)  General Appearance: Alert, cooperative, no distress, appears stated age Head: Normocephalic, without obvious abnormality, atraumatic Eyes: PERRL, conjunctiva/corneas clear, EOM's intact, fundi benign, both eyes      Ears: Normal TM's and external ear canals, both ears Throat: Lips, mucosa, and tongue normal; teeth and gums normal Neck: Supple, symmetrical, trachea midline, no adenopathy; thyroid: No enlargement/tenderness/nodules; no carotid bruit or JVD Back: Symmetric, no curvature, ROM normal, no CVA tenderness. The patient's lumbar incision is well-healed. Lungs: Clear to auscultation bilaterally, respirations unlabored Heart: Regular rate and rhythm, S1 and S2 normal, no murmur, rub or gallop Abdomen: Soft, non-tender, bowel sounds active all four quadrants, no masses, no organomegaly Extremities: Extremities normal, atraumatic, no cyanosis or edema Pulses: 2+ and symmetric all extremities Skin: Skin color, texture, turgor normal, no rashes or lesions  NEUROLOGIC:   Mental status: alert and oriented, no aphasia, good attention span, Fund of knowledge/ memory ok Motor Exam - grossly normal Sensory Exam - grossly normal Reflexes:  Coordination - grossly normal Gait - grossly normal Balance - grossly normal Cranial Nerves: I: smell Not tested  II: visual acuity  OS: Normal    OD: Normal   II: visual fields Full to confrontation  II: pupils Equal, round, reactive to light  III,VII: ptosis None  III,IV,VI: extraocular muscles  Full ROM  V: mastication Normal  V: facial light touch sensation  Normal  V,VII: corneal reflex  Present  VII: facial muscle function - upper  Normal  VII: facial muscle function - lower Normal   VIII: hearing Not tested  IX: soft palate elevation  Normal  IX,X: gag reflex Present  XI: trapezius strength  5/5  XI: sternocleidomastoid strength 5/5  XI: neck flexion strength  5/5  XII: tongue strength  Normal    Data Review Lab Results  Component Value Date   WBC 11.7* 07/29/2013   HGB 15.8 07/29/2013   HCT 44.0 07/29/2013   MCV 86.6 07/29/2013   PLT 223 07/29/2013   Lab Results  Component Value Date   NA 139 07/29/2013   K 4.7 07/29/2013   CL 102 07/29/2013   CO2 25 07/29/2013   BUN 18 07/29/2013   CREATININE 0.91 07/29/2013   GLUCOSE 118* 07/29/2013   No results found for this basename: INR, PROTIME    Assessment/Plan: L4-5 disc degeneration, lumbago, lumbar radiculopathy, lumbar stenosis: I discussed the situation with the patient.  I have reviewed his imaging studies with them and pointed out the abnormalities. We have discussed the various treatment options including surgery. I have described the surgical treatment option of an L4-5 redo laminectomy, instrumentation and fusion. I have shown him surgical models. We have discussed the risks, benefits, alternatives, and likelihood of achieving our goals with surgery. I have answered all the patient's questions. He has decided to proceed with surgery.   Devonne Kitchen D 08/07/2013 8:16 AM

## 2013-08-07 NOTE — Anesthesia Procedure Notes (Signed)
Procedure Name: Intubation Date/Time: 08/07/2013 8:30 AM Performed by: Gayla Medicus Pre-anesthesia Checklist: Patient identified, Timeout performed, Emergency Drugs available, Suction available and Patient being monitored Patient Re-evaluated:Patient Re-evaluated prior to inductionOxygen Delivery Method: Circle system utilized Preoxygenation: Pre-oxygenation with 100% oxygen Intubation Type: IV induction Ventilation: Mask ventilation without difficulty and Oral airway inserted - appropriate to patient size Laryngoscope Size: Mac and 4 Grade View: Grade I Tube type: Oral Tube size: 7.5 mm Number of attempts: 1 Airway Equipment and Method: Stylet Placement Confirmation: ETT inserted through vocal cords under direct vision,  positive ETCO2 and breath sounds checked- equal and bilateral Secured at: 23 cm Tube secured with: Tape Dental Injury: Teeth and Oropharynx as per pre-operative assessment

## 2013-08-07 NOTE — Transfer of Care (Signed)
Immediate Anesthesia Transfer of Care Note  Patient: Sean Fritz  Procedure(s) Performed: Procedure(s) with comments: LUMBAR FOUR-FIVE LAMINECTOMIES WITH POSTERIOR LUMBAR INTERBODY FUSION AND INTERBODY PROSTHESIS WITH POSTERIOR LATERAL ARTHRODESIS AND POSTERIOR NONSEGMENTAL INSTRUMENTATION. (N/A) - BILATERAL  Patient Location: PACU  Anesthesia Type:General  Level of Consciousness: awake, alert  and oriented  Airway & Oxygen Therapy: Patient Spontanous Breathing and Patient connected to nasal cannula oxygen  Post-op Assessment: Report given to PACU RN, Post -op Vital signs reviewed and stable and Patient moving all extremities X 4  Post vital signs: Reviewed and stable  Complications: No apparent anesthesia complications

## 2013-08-08 ENCOUNTER — Inpatient Hospital Stay (HOSPITAL_COMMUNITY): Payer: Medicare Other

## 2013-08-08 LAB — CBC
MCH: 31.2 pg (ref 26.0–34.0)
MCV: 88.4 fL (ref 78.0–100.0)
Platelets: 177 10*3/uL (ref 150–400)
RDW: 12.1 % (ref 11.5–15.5)
WBC: 17 10*3/uL — ABNORMAL HIGH (ref 4.0–10.5)

## 2013-08-08 LAB — URINE MICROSCOPIC-ADD ON

## 2013-08-08 LAB — URINALYSIS, ROUTINE W REFLEX MICROSCOPIC
Bilirubin Urine: NEGATIVE
Glucose, UA: NEGATIVE mg/dL
Ketones, ur: NEGATIVE mg/dL
Nitrite: NEGATIVE
Protein, ur: NEGATIVE mg/dL
Specific Gravity, Urine: 1.021 (ref 1.005–1.030)
Urobilinogen, UA: 0.2 mg/dL (ref 0.0–1.0)
pH: 5.5 (ref 5.0–8.0)

## 2013-08-08 LAB — BASIC METABOLIC PANEL
Calcium: 8.5 mg/dL (ref 8.4–10.5)
Chloride: 100 mEq/L (ref 96–112)
Creatinine, Ser: 1 mg/dL (ref 0.50–1.35)
GFR calc Af Amer: >90 mL/min (ref >90)
GFR calc non Af Amer: 82 mL/min — ABNORMAL LOW (ref >90)
Potassium: 4.2 mEq/L (ref 3.5–5.1)

## 2013-08-08 MED ORDER — MORPHINE SULFATE 2 MG/ML IJ SOLN
2.0000 mg | INTRAMUSCULAR | Status: DC | PRN
  Administered 2013-08-08 – 2013-08-09 (×2): 2 mg via INTRAVENOUS
  Filled 2013-08-08 (×2): qty 1

## 2013-08-08 MED ORDER — NIACIN ER 500 MG PO CPCR
1000.0000 mg | ORAL_CAPSULE | Freq: Every day | ORAL | Status: DC
  Administered 2013-08-08 – 2013-08-09 (×2): 1000 mg via ORAL
  Filled 2013-08-08 (×3): qty 2

## 2013-08-08 MED FILL — Heparin Sodium (Porcine) Inj 1000 Unit/ML: INTRAMUSCULAR | Qty: 30 | Status: AC

## 2013-08-08 MED FILL — Sodium Chloride IV Soln 0.9%: INTRAVENOUS | Qty: 1000 | Status: AC

## 2013-08-08 NOTE — Progress Notes (Signed)
Pt's temp read 102.8 at 0533,pt already using the incentive spirometry,Dr Newell Coral (on call) paged and notified,ordered to send urine for analysis and culture,same done,tab tylenol 650mg  given at 0552 and pt encouraged to continue using the incentive spirometry,will however continue to monitor. Obasogie-Asidi, Aeon Kessner Efe

## 2013-08-08 NOTE — Progress Notes (Signed)
UR COMPLETED  

## 2013-08-08 NOTE — Progress Notes (Signed)
Patient ID: Sean Fritz, male   DOB: 04-28-1956, 57 y.o.   MRN: [id number] Subjective:  The patient is alert and pleasant. He looks well. He is in no apparent distress.  Objective: Vital signs in last 24 hours: Temp:  [97.9 F (36.6 C)-102.8 F (39.3 C)] 100.6 F (38.1 C) (11/13 0802) Pulse Rate:  [60-96] 96 (11/13 0802) Resp:  [8-22] 20 (11/13 0802) BP: (97-120)/(56-68) 112/60 mmHg (11/13 0802) SpO2:  [92 %-100 %] 95 % (11/13 0802) Weight:  [95.89 kg (211 lb 6.4 oz)] 95.89 kg (211 lb 6.4 oz) (11/12 1800)  Intake/Output from previous day: 11/12 0701 - 11/13 0700 In: 2600 [P.O.:600; I.V.:1750; IV Piggyback:250] Out: 1925 [Urine:1725; Blood:200] Intake/Output this shift: Total I/O In: -  Out: 300 [Urine:300]  Physical exam patient is alert and oriented. His strength is normal in his lower extremities.  Lab Results:  Recent Labs  08/08/13 0430  WBC 17.0*  HGB 12.4*  HCT 35.1*  PLT 177   BMET  Recent Labs  08/08/13 0430  NA 137  K 4.2  CL 100  CO2 25  GLUCOSE 120*  BUN 16  CREATININE 1.00  CALCIUM 8.5    Studies/Results: Dg Lumbar Spine 2-3 Views  08/07/2013   CLINICAL DATA:  L4-5 fusion  EXAM: DG C-ARM 1-60 MIN; LUMBAR SPINE - 2-3 VIEW  COMPARISON:  08/07/2013  FINDINGS: Bilateral pedicle screws within interposed disc spacer are in place in L4 and L5. Anatomic alignment. No vertebral compression deformity.  IMPRESSION: Posterior L4-5 fusion.   Electronically Signed   By: Maryclare Bean M.D.   On: 08/07/2013 15:38   Dg Lumbar Spine 1 View  08/07/2013   CLINICAL DATA:  L4-5 fusion  EXAM: LUMBAR SPINE - 1 VIEW  COMPARISON:  05/28/2013  FINDINGS: Surgical instrument overlying the spinal canal the L4-5 level. No acute bony change.  IMPRESSION: L4-5 disc space is localized.   Electronically Signed   By: Marlan Palau M.D.   On: 08/07/2013 10:15   Dg C-arm 1-60 Min  08/07/2013   CLINICAL DATA:  L4-5 fusion  EXAM: DG C-ARM 1-60 MIN; LUMBAR SPINE - 2-3 VIEW  COMPARISON:   08/07/2013  FINDINGS: Bilateral pedicle screws within interposed disc spacer are in place in L4 and L5. Anatomic alignment. No vertebral compression deformity.  IMPRESSION: Posterior L4-5 fusion.   Electronically Signed   By: Maryclare Bean M.D.   On: 08/07/2013 15:38    Assessment/Plan: Postop day 1: The patient is doing well. We will mobilize him with PT and OT. I will discontinue the PCA pump.  Fever: I will check a analysis, urine cultures, blood cultures, and chest x-ray.  LOS: 1 day     Majed Pellegrin D 08/08/2013, 10:06 AM

## 2013-08-08 NOTE — Evaluation (Signed)
Physical Therapy Evaluation Patient Details Name: Sean Fritz MRN: [id number] DOB: 02/12/1956 Today's Date: 08/08/2013 Time: 8469-6295 PT Time Calculation (min): 27 min  PT Assessment / Plan / Recommendation History of Present Illness  Patient is a 57 yo male s/p PLF surgery.   Clinical Impression  Patient demonstrates deficits in functional mobility as indicated below. Patient will benefit from continued skilled PT to address deficits and maximize function. Will see as indicated and progress activity as tolerated. Recommend HHPT upon discharge.    PT Assessment  Patient needs continued PT services    Follow Up Recommendations  Home health PT;Supervision/Assistance - 24 hour          Equipment Recommendations  Rolling walker with 5" wheels    Recommendations for Other Services     Frequency Min 5X/week    Precautions / Restrictions Precautions Precautions: Back Precaution Booklet Issued: Yes (comment) Precaution Comments: educated on back precautions and teachback to reinforce Required Braces or Orthoses: Spinal Brace Restrictions Weight Bearing Restrictions: No   Pertinent Vitals/Pain 7/10      Mobility  Bed Mobility Bed Mobility: Rolling Left;Left Sidelying to Sit;Sitting - Scoot to [place] of Bed Rolling Left: 4: Min assist Left Sidelying to Sit: 4: Min assist Sitting - Scoot to [place] of Bed: 5: Supervision Transfers Transfers: Sit to Stand;Stand to Sit Sit to Stand: 4: Min guard Stand to Sit: 4: Min guard Details for Transfer Assistance: VCs for hand placement and min guard for stability Ambulation/Gait Ambulation/Gait Assistance: 4: Min guard Ambulation Distance (Feet): 90 Feet Assistive device: Rolling walker Gait Pattern: Step-to pattern;Decreased stride length;Narrow base of support Gait velocity: decreased General Gait Details: steady but limited with dizziness    Exercises     PT Diagnosis: Difficulty walking;Acute pain  PT Problem List: Decreased  strength;Decreased range of motion;Decreased activity tolerance;Decreased balance;Decreased mobility;Decreased knowledge of use of DME PT Treatment Interventions: DME instruction;Gait training;Stair training;Functional mobility training;Therapeutic activities;Therapeutic exercise;Balance training;Patient/family education     PT Goals(Current goals can be found in the care plan section) Acute Rehab PT Goals Patient Stated Goal: to go home PT Goal Formulation: With patient Time For Goal Achievement: 08/22/13 Potential to Achieve Goals: Good  Visit Information  Last PT Received On: 08/08/13 Assistance Needed: +1 History of Present Illness: Patient is a 57 yo male s/p PLF surgery.        Prior Functioning  Home Living Family/patient expects to be discharged to:: Private residence Living Arrangements: Alone Available Help at Discharge: Friend(s) Type of Home: House Home Access: Stairs to enter [employer] of Steps: 1 Entrance Stairs-Rails: Right;Left Home Layout: One level;Full bath on main level Home Equipment: Walker - 2 wheels;Cane - single point Prior Function Level of Independence: Independent Communication Communication: No difficulties Dominant Hand: Right    Cognition  Cognition Arousal/Alertness: Awake/alert Behavior During Therapy: WFL for tasks assessed/performed Overall Cognitive Status: Within Functional Limits for tasks assessed    Extremity/Trunk Assessment Lower Extremity Assessment Lower Extremity Assessment: Overall WFL for tasks assessed   Balance Balance Balance Assessed: Yes Static Sitting Balance Static Sitting - Balance Support: Feet supported Static Sitting - Level of Assistance: 7: Independent Static Sitting - Comment/# of Minutes: 6 minutes Static Standing Balance Static Standing - Balance Support: Bilateral upper extremity supported;During functional activity Static Standing - Level of Assistance: 5: Stand by assistance Static  Standing - Comment/# of Minutes: 3 minutes.  End of Session PT - End of Session Equipment Utilized During Treatment: Gait belt;Back brace Activity Tolerance: Patient  limited by fatigue;Patient limited by pain;Other (comment) (dizziness) Patient left: in chair;with call bell/phone within reach;with family/visitor present Nurse Communication: Mobility status  GP     Fabio Asa 08/08/2013, 2:40 PM Charlotte Crumb, PT DPT  [phone]

## 2013-08-08 NOTE — Progress Notes (Signed)
OT Cancellation Note  Patient Details Name: Sean Fritz MRN: [id number] DOB: [birth year]   Cancelled Treatment:    Reason Eval/Treat Not Completed: Pain limiting ability to participate;Medical issues which prohibited therapy (fever & nausea)  Sean Fritz, [name] 08/08/2013, 2:40 PM

## 2013-08-09 NOTE — Social Work (Signed)
Referred to this CSW today for ?SNF. Chart reviewed and have spoken with RNCM and Care Coordinator who indicate patient plans to d/c home with HH and DME. PT also recommending home with HH and not SNF. CSW to sign off- please contact us if SW needs arise. Akim Watkinson, MSW, LCSWA [phone]   

## 2013-08-09 NOTE — Progress Notes (Signed)
Physical Therapy Treatment Patient Details Name: Sean Fritz MRN: [id number] DOB: 11-15-1955 Today's Date: 08/09/2013 Time: 5366-4403 PT Time Calculation (min): 19 min  PT Assessment / Plan / Recommendation  History of Present Illness Patient is a 57 yo male s/p PLF surgery.    PT Comments   Patient continues to present with increased pain, patient demonstrates improved ambulation distance today but overall still limited by dizziness and pain. Will continue to see and progress activity as tolerated.  Follow Up Recommendations  Home health PT;Supervision/Assistance - 24 hour     Does the patient have the potential to tolerate intense rehabilitation     Barriers to Discharge        Equipment Recommendations  Rolling walker with 5" wheels    Recommendations for Other Services    Frequency Min 5X/week   Progress towards PT Goals    Plan Current plan remains appropriate    Precautions / Restrictions Precautions Precautions: Back Precaution Booklet Issued: Yes (comment) Precaution Comments: educated on back precautions and teachback to reinforce Required Braces or Orthoses: Spinal Brace Restrictions Weight Bearing Restrictions: No   Pertinent Vitals/Pain 7/10    Mobility  Bed Mobility Bed Mobility: Not assessed Transfers Transfers: Sit to Stand;Stand to Sit Sit to Stand: 4: Min guard Stand to Sit: 4: Min guard Details for Transfer Assistance: VCs for hand placement and min guard for stability Ambulation/Gait Ambulation/Gait Assistance: 4: Min guard Ambulation Distance (Feet): 180 Feet Assistive device: Rolling walker Gait Pattern: Step-to pattern;Decreased stride length;Narrow base of support Gait velocity: decreased General Gait Details: steady but limited with dizziness     PT Goals (current goals can now be found in the care plan section) Acute Rehab PT Goals Patient Stated Goal: to go home PT Goal Formulation: With patient Time For Goal Achievement:  08/22/13 Potential to Achieve Goals: Good  Visit Information  Last PT Received On: 08/09/13 Assistance Needed: +1 History of Present Illness: Patient is a 57 yo male s/p PLF surgery.     Subjective Data  Subjective: I am having a lot of pain today Patient Stated Goal: to go home   Cognition  Cognition Arousal/Alertness: Awake/alert Behavior During Therapy: WFL for tasks assessed/performed Overall Cognitive Status: Within Functional Limits for tasks assessed    Balance  Balance Balance Assessed: Yes Static Sitting Balance Static Sitting - Balance Support: Feet supported Static Sitting - Level of Assistance: 7: Independent Static Standing Balance Static Standing - Balance Support: Bilateral upper extremity supported;During functional activity Static Standing - Level of Assistance: 5: Stand by assistance  End of Session PT - End of Session Equipment Utilized During Treatment: Gait belt;Back brace Activity Tolerance: Patient limited by fatigue;Patient limited by pain;Other (comment) Patient left: in chair;with call bell/phone within reach;with family/visitor present Nurse Communication: Mobility status   GP     Fabio Asa 08/09/2013, 9:44 AM Charlotte Crumb, PT DPT  [phone]

## 2013-08-09 NOTE — Progress Notes (Signed)
Patient ID: Sean Fritz, male   DOB: 1956/08/16, 57 y.o.   MRN: [id number] Subjective:  The patient is alert and pleasant. His back is appropriately sore. He looks well.  Objective: Vital signs in last 24 hours: Temp:  [97.3 F (36.3 C)-101.9 F (38.8 C)] 97.3 F (36.3 C) (11/14 1030) Pulse Rate:  [81-101] 81 (11/14 1030) Resp:  [18-20] 18 (11/14 1030) BP: (96-145)/(48-66) 101/48 mmHg (11/14 1030) SpO2:  [92 %-97 %] 95 % (11/14 1030)  Intake/Output from previous day: 11/13 0701 - 11/14 0700 In: 600 [P.O.:600] Out: 551 [Urine:551] Intake/Output this shift:    Physical exam patient is alert and oriented. His strength is normal his lower extremities.  Lab Results:  Recent Labs  08/08/13 0430  WBC 17.0*  HGB 12.4*  HCT 35.1*  PLT 177   BMET  Recent Labs  08/08/13 0430  NA 137  K 4.2  CL 100  CO2 25  GLUCOSE 120*  BUN 16  CREATININE 1.00  CALCIUM 8.5    Studies/Results: Dg Chest 1 View  08/08/2013   CLINICAL DATA:  Postop  EXAM: CHEST - 1 VIEW  COMPARISON:  07/29/2013  FINDINGS: Cardiomediastinal silhouette is stable. No acute infiltrate or pulmonary edema. Mild elevation of the left hemidiaphragm with left basilar atelectasis.  IMPRESSION: No acute infiltrate or pulmonary edema. Mild elevation of left hemidiaphragm with left basilar atelectasis.   Electronically Signed   By: Natasha Mead M.Fritz.   On: 08/08/2013 12:02    Assessment/Plan: Postop day #2: We'll continue to mobilize the patient with PT and OT.  Low-grade fevers: The patient's chest x-ray demonstrates no pneumonia. His urinalysis is okay. I will check a CBC/white count tomorrow.  The patient may go home tomorrow.  LOS: 2 days     Sean Fritz 08/09/2013, 12:58 PM

## 2013-08-09 NOTE — Evaluation (Signed)
Occupational Therapy Evaluation Patient Details Name: Sean Fritz MRN: [id number] DOB: 12-12-1955 Today's Date: 08/09/2013 Time: 4098-1191 OT Time Calculation (min): 44 min  OT Assessment / Plan / Recommendation History of present illness Patient is a 57 yo male s/p PLF surgery.    Clinical Impression   Pt admitted for above surgery and has the deficits listed below.  Pt would benefit from cont OT to address I with LE adls only.  Pt doing well with adls overall.    OT Assessment  Patient needs continued OT Services    Follow Up Recommendations  No OT follow up;Supervision/Assistance - 24 hour    Barriers to Discharge   girlfriend to be there for a weekl.  Equipment Recommendations  None recommended by OT    Recommendations for Other Services    Frequency  Min 2X/week    Precautions / Restrictions Precautions Precautions: Back Precaution Booklet Issued: Yes (comment) Precaution Comments: educated on back precautions and teachback to reinforce Required Braces or Orthoses: Spinal Brace Restrictions Weight Bearing Restrictions: No   Pertinent Vitals/Pain Pt c/o pain in lower back 6/10.  Vitals stable.    ADL  Eating/Feeding: Performed;Independent Where Assessed - Eating/Feeding: Chair Grooming: Performed;Wash/dry hands;Teeth care;Supervision/safety Where Assessed - Grooming: Supported standing Upper Body Bathing: Simulated;Set up Where Assessed - Upper Body Bathing: Unsupported sitting Lower Body Bathing: Simulated;Moderate assistance Where Assessed - Lower Body Bathing: Supported sit to stand Upper Body Dressing: Performed;Set up Where Assessed - Upper Body Dressing: Unsupported sitting Lower Body Dressing: Performed;Moderate assistance Where Assessed - Lower Body Dressing: Supported sit to stand Toilet Transfer: Research scientist (life sciences) Method: Sit to stand;Other (comment) (walked to bathroom) Toilet Transfer Equipment: Comfort height  toilet;Grab bars Toileting - Clothing Manipulation and Hygiene: Performed;Supervision/safety Where Assessed - Engineer, mining and Hygiene: Standing Tub/Shower Transfer: Engineer, manufacturing Method: Science writer: Grab bars Equipment Used: Rolling walker Transfers/Ambulation Related to ADLs: Pt moved well despite being in a lot of pain with mobility. ADL Comments: Pt requires assist with LE adls only due to pain.  Pt would benefit from LE AE but may just have girlfriend assist for first few days.    OT Diagnosis: Generalized weakness;Acute pain  OT Problem List: Decreased knowledge of use of DME or AE;Pain OT Treatment Interventions: Self-care/ADL training;DME and/or AE instruction   OT Goals(Current goals can be found in the care plan section) Acute Rehab OT Goals Patient Stated Goal: to go home OT Goal Formulation: With patient Time For Goal Achievement: 08/16/13 Potential to Achieve Goals: Good ADL Goals Pt Will Perform Upper Body Bathing: with supervision;with adaptive equipment;sitting;standing Pt Will Perform Lower Body Dressing: with supervision;with adaptive equipment;sit to/from stand  Visit Information  Last OT Received On: 08/09/13 Assistance Needed: +1 History of Present Illness: Patient is a 57 yo male s/p PLF surgery.        Prior Functioning     Home Living Family/patient expects to be discharged to:: Private residence Living Arrangements: Alone Available Help at Discharge: Friend(s) Type of Home: House Home Access: Stairs to enter [employer] of Steps: 1 Entrance Stairs-Rails: Right;Left Home Layout: One level;Full bath on main level Home Equipment: Walker - 2 wheels;Cane - single point;Grab bars - toilet;Grab bars - tub/shower Prior Function Level of Independence: Independent Communication Communication: No difficulties Dominant Hand: Right         Vision/Perception  Vision - History Baseline Vision: No visual deficits Patient Visual Report: No change from baseline Vision - Assessment Vision Assessment: Vision  not tested   Cognition  Cognition Arousal/Alertness: Awake/alert Behavior During Therapy: WFL for tasks assessed/performed Overall Cognitive Status: Within Functional Limits for tasks assessed    Extremity/Trunk Assessment Upper Extremity Assessment Upper Extremity Assessment: Overall WFL for tasks assessed Lower Extremity Assessment Lower Extremity Assessment: Defer to PT evaluation Cervical / Trunk Assessment Cervical / Trunk Assessment: Normal     Mobility Bed Mobility Bed Mobility: Rolling Left;Left Sidelying to Sit;Sitting - Scoot to Edge of Bed Rolling Left: 4: Min assist Left Sidelying to Sit: 4: Min assist Sitting - Scoot to [place] of Bed: 5: Supervision Transfers Transfers: Sit to Stand;Stand to Sit Sit to Stand: 4: Min guard Stand to Sit: 4: Min guard Details for Transfer Assistance: VCs for hand placement and min guard for stability     Exercise     Balance Balance Balance Assessed: Yes Static Sitting Balance Static Sitting - Balance Support: Feet supported Static Sitting - Level of Assistance: 7: Independent Static Sitting - Comment/# of Minutes: 5 Static Standing Balance Static Standing - Balance Support: Bilateral upper extremity supported;During functional activity Static Standing - Level of Assistance: 5: Stand by assistance Static Standing - Comment/# of Minutes: 3 at sink   End of Session OT - End of Session Equipment Utilized During Treatment: Rolling walker;Back brace Activity Tolerance: Patient tolerated treatment well Patient left: in chair;with call bell/phone within reach Nurse Communication: Mobility status  GO     Hope Budds 08/09/2013, 9:45 AM [phone]

## 2013-08-10 LAB — CBC
HCT: 29.5 % — ABNORMAL LOW (ref 39.0–52.0)
Hemoglobin: 10.5 g/dL — ABNORMAL LOW (ref 13.0–17.0)
MCV: 87.8 fL (ref 78.0–100.0)
RBC: 3.36 MIL/uL — ABNORMAL LOW (ref 4.22–5.81)
WBC: 12.6 10*3/uL — ABNORMAL HIGH (ref 4.0–10.5)

## 2013-08-10 MED ORDER — DIAZEPAM 5 MG PO TABS: 5.0000 mg | ORAL_TABLET | Freq: Four times a day (QID) | ORAL | Status: DC | PRN

## 2013-08-10 MED ORDER — OXYCODONE-ACETAMINOPHEN 10-325 MG PO TABS: 1.0000 | ORAL_TABLET | ORAL | Status: DC | PRN

## 2013-08-10 MED ORDER — DSS 100 MG PO CAPS: 100.0000 mg | ORAL_CAPSULE | Freq: Two times a day (BID) | ORAL | Status: DC

## 2013-08-10 NOTE — Progress Notes (Signed)
Ambulated down hallway, 1 assist, brace, walker. Tolerated well, back to bed with call bell in reach

## 2013-08-10 NOTE — Progress Notes (Signed)
MD thinks HHPT not necessary, and patient has a RW at home, no other DME needs or home health care needs identified

## 2013-08-10 NOTE — Discharge Summary (Signed)
Physician Discharge Summary  Patient ID: Sean Fritz MRN: [id number] DOB/AGE: 01/08/1956 57 y.o.  Admit date: 08/07/2013 Discharge date: 08/10/2013  Admission Diagnoses: L4-5 degenerative disc disease, lumbago  Discharge Diagnoses: The same Active Problems:   * No active hospital problems. *   Discharged Condition: good  Hospital Course: I performed a redo L4-5 laminectomy, instrumentation and fusion on the patient on 08/07/2013. The surgery went well.  The patient's postoperative course was remarkable only for some fevers. He was worked up with a chest x-ray urinalysis blood cultures etc. the fevers resolved. His white count decreased.  On postop day #3 the patient requested discharge to home. He was given oral and written discharge instructions. His questions were answered.  Consults: PT/OT Significant Diagnostic Studies: None Treatments: L4-5 decompression, instrumentation, and fusion Discharge Exam: Blood pressure 129/51, pulse 82, temperature 99.5 F (37.5 C), temperature source Oral, resp. rate 20, height 5\' 8"  (1.727 m), weight 95.89 kg (211 lb 6.4 oz), SpO2 93.00%. Patient is alert and pleasant. His strength is normal. He looks well. His dressing has a small old bloodstain.  Disposition: Home  Discharge Orders   Future Appointments Provider Department Dept Phone   09/23/2013 10:00 AM Lennette Bihari, MD [facility] [phone]   Future Orders Complete By Expires   Call MD for:  difficulty breathing, headache or visual disturbances  As directed    Call MD for:  extreme fatigue  As directed    Call MD for:  hives  As directed    Call MD for:  persistant dizziness or light-headedness  As directed    Call MD for:  persistant nausea and vomiting  As directed    Call MD for:  redness, tenderness, or signs of infection (pain, swelling, redness, odor or green/yellow discharge around incision site)  As directed    Call MD for:  severe uncontrolled pain  As  directed    Call MD for:  temperature >100.4  As directed    Diet - low sodium heart healthy  As directed    Discharge instructions  As directed    Comments:     Call [phone] for a followup appointment. Take a stool softener while you are using pain medications.   Driving Restrictions  As directed    Comments:     Do not drive for 2 weeks.   Increase activity slowly  As directed    Lifting restrictions  As directed    Comments:     Do not lift more than 5 pounds. No excessive bending or twisting.   May shower / Bathe  As directed    Comments:     He may shower after the pain she is removed 3 days after surgery. Leave the incision alone.   No dressing needed  As directed        Medication List    STOP taking these medications       chlorhexidine 0.12 % solution  Commonly known as:  PERIDEX     HYDROcodone-acetaminophen 7.5-325 MG per tablet  Commonly known as:  NORCO     tiZANidine 4 MG tablet  Commonly known as:  ZANAFLEX      TAKE these medications       amLODipine 10 MG tablet  Commonly known as:  NORVASC  Take 1 tablet (10 mg total) by mouth daily.     aspirin 81 MG tablet  Take 81 mg by mouth daily.     atorvastatin 80 MG  tablet  Commonly known as:  LIPITOR  Take 1 tablet (80 mg total) by mouth daily.     buPROPion 150 MG 24 hr tablet  Commonly known as:  WELLBUTRIN XL  Take 150 mg by mouth 3 (three) times daily.     clopidogrel 75 MG tablet  Commonly known as:  PLAVIX  Take 1 tablet (75 mg total) by mouth daily.     diazepam 5 MG tablet  Commonly known as:  VALIUM  Take 1 tablet (5 mg total) by mouth every 6 (six) hours as needed for muscle spasms.     diphenhydramine-acetaminophen 25-500 MG Tabs  Commonly known as:  TYLENOL PM  Take 2 tablets by mouth at bedtime.     DSS 100 MG Caps  Take 100 mg by mouth 2 (two) times daily.     EPIPEN 2-PAK 0.3 mg/0.3 mL Soaj injection  Generic drug:  EPINEPHrine     gabapentin 400 MG capsule  Commonly  known as:  NEURONTIN  Take 1 capsule (400 mg total) by mouth 3 (three) times daily as needed.     isosorbide mononitrate 60 MG 24 hr tablet  Commonly known as:  IMDUR  Take 1 tablet (60 mg total) by mouth daily.     metoprolol succinate 50 MG 24 hr tablet  Commonly known as:  TOPROL-XL  Take 1 &1/2 tablets daily     niacin 500 MG CR tablet  Commonly known as:  NIASPAN  Take 1,000 mg by mouth at bedtime.     nitroGLYCERIN 0.4 MG SL tablet  Commonly known as:  NITROSTAT  Place 0.4 mg under the tongue as needed.     oxyCODONE-acetaminophen 10-325 MG per tablet  Commonly known as:  PERCOCET  Take 1 tablet by mouth every 4 (four) hours as needed for pain.     ramipril 10 MG tablet  Commonly known as:  ALTACE  Take 1 tablet (10 mg total) by mouth daily.     Vitamin D (Ergocalciferol) 50000 UNITS Caps capsule  Commonly known as:  DRISDOL  Take 50,000 Units by mouth every 7 (seven) days.         SignedCristi Loron 08/10/2013, 9:37 AM

## 2013-08-11 LAB — URINE CULTURE

## 2013-08-14 LAB — CULTURE, BLOOD (ROUTINE X 2)
Culture: NO GROWTH
Culture: NO GROWTH

## 2013-09-23 ENCOUNTER — Encounter: Payer: Self-pay | Admitting: Cardiovascular Disease

## 2013-09-23 ENCOUNTER — Ambulatory Visit (INDEPENDENT_AMBULATORY_CARE_PROVIDER_SITE_OTHER): Payer: Medicare Other | Admitting: Cardiovascular Disease

## 2013-09-23 VITALS — BP 130/70 | HR 70 | Ht 68.0 in | Wt 209.7 lb

## 2013-09-23 DIAGNOSIS — I2510 Atherosclerotic heart disease of native coronary artery without angina pectoris: Secondary | ICD-10-CM

## 2013-09-23 DIAGNOSIS — E782 Mixed hyperlipidemia: Secondary | ICD-10-CM

## 2013-09-23 DIAGNOSIS — E785 Hyperlipidemia, unspecified: Secondary | ICD-10-CM

## 2013-09-23 DIAGNOSIS — I10 Essential (primary) hypertension: Secondary | ICD-10-CM

## 2013-09-23 DIAGNOSIS — Z79899 Other long term (current) drug therapy: Secondary | ICD-10-CM

## 2013-09-23 DIAGNOSIS — R5381 Other malaise: Secondary | ICD-10-CM

## 2013-09-23 NOTE — Patient Instructions (Signed)
LABS IN 6 MONTH CBC, CMP LIPID TSH WILL SEND YOU THE LABS IN 5 MONTHS.   Your physician wants you to follow-up in 6 MONTHS Dr Tresa Endo.  You will receive a reminder letter in the mail two months in advance. If you don't receive a letter, please call our office to schedule the follow-up appointment.

## 2013-09-24 ENCOUNTER — Encounter: Payer: Self-pay | Admitting: Cardiovascular Disease

## 2013-09-24 NOTE — Progress Notes (Signed)
Patient ID: [name], male   DOB: Jan 31, 1956, 57 y.o.   MRN: [id number]      HPI: Sean Fritz, is a 57 y.o. male with known coronary artery disease who presents to the office today for followup cardiology evaluation. Since I last saw him , he underwent spinal fusion at L4/L5 by Dr. Lovell Sheehan.  Sean Fritz has known coronary artery disease. In January 1998 he suffered a non-Q-wave MI and underwent PTCA of his mid distal right carotid artery as well as proximal circumflex coronary artery. In July 1998 he underwent stenting of his right coronary artery. In March 2011 he underwent stenting of a 90% diagonal stenosis. Additional problems include mixed hyperlipidemia with low HDL levels and high triglycerides consistent with an atherogenic dyslipidemia pattern, hypertension, degenerative joint disease, as well as mild obesity. Over the past 6 months, he has remained stable. He denies any recurrent anginal symptoms. He underwent injection of his lumbar vertebrae at the pain clinic at the end of July. On  08/07/2013 he underwent L4-L5 redo laminectomy and has noticed significant resolution in some of his previous low semi-symptomatology. His back pain is slowly improving and he is now able to ambulate better. He does have issues with urinary incontinence and there is remote history of prostate CA. He tolerated his back surgery from a cardiac standpoint without compromise.   Past Medical History  Diagnosis Date  . Hypertension   . Depression   . Mixed hyperlipidemia   . Degenerative joint disease   . Cancer     Prostate   . Myocardial infarction 1998     stress Lexiscan Normal; Low risk scan. EF is 58%. Echo 12/07/09/ normal EF > 55%   . Inability to control urination     d/t prostate surgery; pt wears pad  . Constipation due to pain medication   . Rosacea     on nose  . Coronary artery disease     Past Surgical History  Procedure Laterality Date  . Coronary angioplasty with stent placement  1998  & 12/01/09    2.25 x56mm Taxus Atom Liberte DES stent-diagnal vessel with cutting balloon arthrotomy.  . Back surgery  Nov 2009  . Prostate surgery    . Elbow surgery Left July 2010  . Tonsillectomy      Allergies  Allergen Reactions  . No Known Drug Allergy     Current Outpatient Prescriptions  Medication Sig Dispense Refill  . amLODipine (NORVASC) 10 MG tablet Take 1 tablet (10 mg total) by mouth daily.  90 tablet  3  . aspirin 81 MG tablet Take 81 mg by mouth daily.       Marland Kitchen atorvastatin (LIPITOR) 80 MG tablet Take 1 tablet (80 mg total) by mouth daily.  90 tablet  3  . buPROPion (WELLBUTRIN XL) 150 MG 24 hr tablet Take 150 mg by mouth 3 (three) times daily.      . cephALEXin (KEFLEX) 500 MG capsule Take 500 mg by mouth 4 (four) times daily.      . clopidogrel (PLAVIX) 75 MG tablet Take 1 tablet (75 mg total) by mouth daily.  90 tablet  3  . diazepam (VALIUM) 5 MG tablet Take 1 tablet (5 mg total) by mouth every 6 (six) hours as needed for muscle spasms.  50 tablet  1  . diphenhydramine-acetaminophen (TYLENOL PM) 25-500 MG TABS Take 2 tablets by mouth at bedtime.      Tery Sanfilippo Sodium (DSS) 100 MG CAPS Take  100 mg by mouth daily as needed.      Marland Kitchen EPIPEN 2-PAK 0.3 MG/0.3ML SOAJ injection       . gabapentin (NEURONTIN) 400 MG capsule Take 1 capsule (400 mg total) by mouth 3 (three) times daily as needed.  90 capsule  1  . isosorbide mononitrate (IMDUR) 60 MG 24 hr tablet Take 1 tablet (60 mg total) by mouth daily.  90 tablet  3  . metoprolol succinate (TOPROL-XL) 50 MG 24 hr tablet Take 1 &1/2 tablets daily  135 tablet  3  . nitroGLYCERIN (NITROSTAT) 0.4 MG SL tablet Place 0.4 mg under the tongue as needed.      Marland Kitchen oxyCODONE-acetaminophen (PERCOCET) 10-325 MG per tablet Take 1 tablet by mouth every 4 (four) hours as needed for pain.  100 tablet  0  . ramipril (ALTACE) 10 MG tablet Take 1 tablet (10 mg total) by mouth daily.  90 tablet  3  . Vitamin D, Ergocalciferol, (DRISDOL) 50000  UNITS CAPS capsule Take 50,000 Units by mouth every 7 (seven) days.      . niacin (NIASPAN) 500 MG CR tablet Take 1,000 mg by mouth at bedtime.        No current facility-administered medications for this visit.    Socially he is divorced. There is a remote tobacco history. He does try to exercise but he has been limited by his significant back discomfort making it very difficult to  ROS is negative for fevers, chills or night sweats. He did have some initial postoperative fevers. He has not had fevers recently. He denies visual changes. He denies change in hearing. There is no lymphadenopathy. He denies anginal symptoms. There is no PND or orthopnea. He denies presyncope or syncope. He denies nausea vomiting or diarrhea. He does have issues with urinary incontinence. His remote history prostate CA. His back pain has improved. He denies any sciatica or discomfort. He denies claudication. There is no edema. There is no diabetes. There is no thyroid issues.  He denies awareness of arrhythmias. He denies PND or orthopnea. He denies presyncope or syncope. There is no shortness of breath. He does have chronic back pain. 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 He denies abdominal pain. He denies bleeding. He denies GI symptoms. He has had issues with erectile function. He denies recent edema. Other comprehensive 14 point system review is negative.  PE BP 130/70  Pulse 70  Ht 5\' 8"  (1.727 m)  Wt 209 lb 11.2 oz (95.119 kg)  BMI 31.89 kg/m2  General: Alert, oriented, no distress.  Skin: normal turgor, no rashes HEENT: Normocephalic, atraumatic. Pupils round and reactive; sclera anicteric;no lid lag.  Nose without nasal septal hypertrophy Mouth/Parynx benign; Mallinpatti scale 2 Neck: No JVD, no carotid briuts; normal upstroke Lungs: clear to ausculatation and percussion; no wheezing or rales Chest wall: No tenderness to palpation Heart: RRR, s1  s2 normal no S3 or S4 gallops. There was a whiff of a systolic murmur. Abdomen: soft, nontender; no hepatosplenomehaly, BS+; abdominal aorta nontender and not dilated by palpation. Back: No CVA tenderness Pulses 2+ Extremities: no clubbing cyanosis or edema, Homan's sign negative  Neurologic: grossly nonfocal Psychological: Normal affect and mood  ECG: Sinus rhythm at 70beats per minute. No ectopy. Intervals normal.  LABS:  BMET    Component Value Date/Time   NA 137 08/08/2013 0430   K 4.2 08/08/2013 0430   CL 100 08/08/2013 0430   CO2  25 08/08/2013 0430   GLUCOSE 120* 08/08/2013 0430   BUN 16 08/08/2013 0430   CREATININE 1.00 08/08/2013 0430   CALCIUM 8.5 08/08/2013 0430   GFRNONAA 82* 08/08/2013 0430   GFRAA >90 08/08/2013 0430     Hepatic Function Panel  No results found for this basename: prot,  albumin,  ast,  alt,  alkphos,  bilitot,  bilidir,  ibili     CBC    Component Value Date/Time   WBC 12.6* 08/10/2013 0525   RBC 3.36* 08/10/2013 0525   HGB 10.5* 08/10/2013 0525   HCT 29.5* 08/10/2013 0525   PLT 179 08/10/2013 0525   MCV 87.8 08/10/2013 0525   MCH 31.3 08/10/2013 0525   MCHC 35.6 08/10/2013 0525   RDW 11.8 08/10/2013 0525     BNP No results found for this basename: probnp    Lipid Panel  No results found for this basename: chol,  trig,  hdl,  cholhdl,  vldl,  ldlcalc     RADIOLOGY: No results found.    ASSESSMENT AND PLAN: SeanFritz is doing well from cardiac standpoint and is s/p remote intervention to his right coronary artery, circumflex vessel, as well as diagonal vessels. He does have an atherogenic dyslipidemia pattern but seems to be tolerating Niaspan and atorvastatin combination with improved HDL to 38 and LDL 69, and VLDL 24 last fall.  His blood pressure is well controlled. His ECG is stable. He underwent his recent lumbar disc surgery without cardiovascular compromise. Blood pressure today when rechecked by me was improved to  110/70. He is taking amlodipine 10 mg metoprolol xl 75 mg. He is now back on his dual antiplatelet therapy with aspirin and Plavix and tolerating this well without bleeding issues. I have suggested he have a followup evaluation with his urologist in light of his recent increase in urinary incontinence following his recent surgery. I will see him in 6 months for followup cardiologic evaluation prior to that evaluation he'll undergo a complete set of laboratory.  Lennette Bihari, MD, [facility]  09/24/2013 7:21 PM

## 2013-09-27 ENCOUNTER — Encounter: Payer: Self-pay | Admitting: Cardiovascular Disease

## 2014-01-15 ENCOUNTER — Encounter: Payer: Self-pay | Admitting: *Deleted

## 2014-01-15 ENCOUNTER — Other Ambulatory Visit: Payer: Self-pay | Admitting: *Deleted

## 2014-01-15 DIAGNOSIS — R5381 Other malaise: Secondary | ICD-10-CM

## 2014-01-15 DIAGNOSIS — E782 Mixed hyperlipidemia: Secondary | ICD-10-CM

## 2014-01-15 DIAGNOSIS — Z79899 Other long term (current) drug therapy: Secondary | ICD-10-CM

## 2014-01-15 DIAGNOSIS — I2510 Atherosclerotic heart disease of native coronary artery without angina pectoris: Secondary | ICD-10-CM

## 2014-01-15 DIAGNOSIS — R5383 Other fatigue: Secondary | ICD-10-CM

## 2014-01-28 ENCOUNTER — Telehealth: Payer: Self-pay | Admitting: *Deleted

## 2014-01-28 NOTE — Telephone Encounter (Signed)
Message copied by Raiford Simmonds on Tue Jan 28, 2014  9:29 AM ------      Message from: Raiford Simmonds      Created: Mon Sep 23, 2013 11:26 AM      Regarding: dr Claiborne Billings patient       Need labs mailed to him in may 2015 and  Go to  lab in June 2015      Before his 6 month office visit            Office visit from 09/23/13 ------

## 2014-01-28 NOTE — Telephone Encounter (Signed)
Labs were release in April 2015 by Mariann Laster CMA for Dr Claiborne Billings. TSH ,LIPID, CMP, CBC

## 2014-02-24 LAB — CBC
HEMATOCRIT: 39.3 % (ref 39.0–52.0)
Hemoglobin: 13.3 g/dL (ref 13.0–17.0)
MCH: 29.4 pg (ref 26.0–34.0)
MCHC: 33.8 g/dL (ref 30.0–36.0)
MCV: 86.9 fL (ref 78.0–100.0)
Platelets: 205 10*3/uL (ref 150–400)
RBC: 4.52 MIL/uL (ref 4.22–5.81)
RDW: 14.3 % (ref 11.5–15.5)
WBC: 7.7 10*3/uL (ref 4.0–10.5)

## 2014-02-25 LAB — COMPREHENSIVE METABOLIC PANEL
ALT: 27 U/L (ref 0–53)
AST: 19 U/L (ref 0–37)
Albumin: 4.6 g/dL (ref 3.5–5.2)
Alkaline Phosphatase: 104 U/L (ref 39–117)
BUN: 22 mg/dL (ref 6–23)
CALCIUM: 9.5 mg/dL (ref 8.4–10.5)
CHLORIDE: 104 meq/L (ref 96–112)
CO2: 29 meq/L (ref 19–32)
Creat: 1.15 mg/dL (ref 0.50–1.35)
GLUCOSE: 112 mg/dL — AB (ref 70–99)
Potassium: 4.4 mEq/L (ref 3.5–5.3)
Sodium: 140 mEq/L (ref 135–145)
Total Bilirubin: 0.5 mg/dL (ref 0.2–1.2)
Total Protein: 6.5 g/dL (ref 6.0–8.3)

## 2014-02-25 LAB — LIPID PANEL
CHOL/HDL RATIO: 3.6 ratio
Cholesterol: 134 mg/dL (ref 0–200)
HDL: 37 mg/dL — AB (ref >39)
LDL CALC: 69 mg/dL (ref 0–99)
Triglycerides: 142 mg/dL (ref <150)
VLDL: 28 mg/dL (ref 0–40)

## 2014-02-25 LAB — TSH: TSH: 1.634 u[IU]/mL (ref 0.350–4.500)

## 2014-03-10 ENCOUNTER — Encounter: Payer: Self-pay | Admitting: Cardiovascular Disease

## 2014-03-10 ENCOUNTER — Ambulatory Visit (INDEPENDENT_AMBULATORY_CARE_PROVIDER_SITE_OTHER): Payer: Medicare Other | Admitting: Cardiovascular Disease

## 2014-03-10 VITALS — BP 122/64 | HR 69 | Ht 68.0 in | Wt 219.8 lb

## 2014-03-10 DIAGNOSIS — E785 Hyperlipidemia, unspecified: Secondary | ICD-10-CM

## 2014-03-10 DIAGNOSIS — E669 Obesity, unspecified: Secondary | ICD-10-CM

## 2014-03-10 DIAGNOSIS — I10 Essential (primary) hypertension: Secondary | ICD-10-CM

## 2014-03-10 DIAGNOSIS — M47817 Spondylosis without myelopathy or radiculopathy, lumbosacral region: Secondary | ICD-10-CM

## 2014-03-10 DIAGNOSIS — I2510 Atherosclerotic heart disease of native coronary artery without angina pectoris: Secondary | ICD-10-CM

## 2014-03-10 MED ORDER — AMLODIPINE BESYLATE 10 MG PO TABS: 10.0000 mg | ORAL_TABLET | Freq: Every day | ORAL | Status: DC

## 2014-03-10 MED ORDER — ISOSORBIDE MONONITRATE ER 60 MG PO TB24: 60.0000 mg | ORAL_TABLET | Freq: Every day | ORAL | Status: DC

## 2014-03-10 MED ORDER — CLOPIDOGREL BISULFATE 75 MG PO TABS: 75.0000 mg | ORAL_TABLET | Freq: Every day | ORAL | Status: DC

## 2014-03-10 MED ORDER — ATORVASTATIN CALCIUM 80 MG PO TABS: 80.0000 mg | ORAL_TABLET | Freq: Every day | ORAL | Status: DC

## 2014-03-10 MED ORDER — RAMIPRIL 10 MG PO CAPS: 10.0000 mg | ORAL_CAPSULE | Freq: Every day | ORAL | Status: DC

## 2014-03-10 MED ORDER — METOPROLOL SUCCINATE ER 50 MG PO TB24: ORAL_TABLET | ORAL | Status: DC

## 2014-03-10 NOTE — Progress Notes (Signed)
Patient ID: Sean Fritz, male   DOB: 1955-10-08, 58 y.o.   MRN: [id number]      HPI: Sean Fritz is a 58 y.o. male with known coronary artery disease who presents to the office today for followup cardiology evaluation.   Sean Fritz has known coronary artery disease and suffered a non-Q-wave MI and underwent PTCA of his mid distal RCA as well as proximal circumflex coronary artery in January 1998. In July 1998 he developed restenosis of his proximal RCA and underwent stenting of his right coronary artery. In March 2011 he underwent stenting of a 90% diagonal stenosis. Additional problems include mixed hyperlipidemia with low HDL levels and high triglycerides consistent with an atherogenic dyslipidemia pattern, hypertension, degenerative joint disease, as well as mild obesity.  On11/08/2013 he underwent L4-L5 redo laminectomy and has noticed significant resolution in some of his previous low extremity symptomatology. His back pain is slowly improving and he is now able to ambulate better. He does have issues with urinary incontinence and there is remote history of prostate CA. He tolerated his back surgery from a cardiac standpoint without compromise.   Since I last saw him in December 2014, he denies recurrent chest pain episodes.  He has not been very successful with weight loss.  He is unable to exercise routinely.  He continues to have urinary incontinence secondary to his prostate surgery.  His leg discomfort has improved.  He is unaware of palpitations.  He denies presyncope or syncope.    Past Medical History  Diagnosis Date  . Hypertension   . Depression   . Mixed hyperlipidemia   . Degenerative joint disease   . Cancer     Prostate   . Myocardial infarction 1998     stress Lexiscan Normal; Low risk scan. EF is 58%. Echo 12/07/09/ normal EF > 55%   . Inability to control urination     d/t prostate surgery; pt wears pad  . Constipation due to pain medication   . Rosacea     on nose  .  Coronary artery disease     Past Surgical History  Procedure Laterality Date  . Coronary angioplasty with stent placement  1998 & 12/01/09    2.25 x88mm Taxus Atom Liberte DES stent-diagnal vessel with cutting balloon arthrotomy.  . Back surgery  Nov 2009  . Prostate surgery    . Elbow surgery Left July 2010  . Tonsillectomy      Allergies  Allergen Reactions  . No Known Drug Allergy     Current Outpatient Prescriptions  Medication Sig Dispense Refill  . amLODipine (NORVASC) 10 MG tablet Take 1 tablet (10 mg total) by mouth daily.  90 tablet  3  . aspirin 81 MG tablet Take 81 mg by mouth daily.       Marland Kitchen atorvastatin (LIPITOR) 80 MG tablet Take 1 tablet (80 mg total) by mouth daily.  90 tablet  3  . buPROPion (WELLBUTRIN XL) 150 MG 24 hr tablet Take 150 mg by mouth 3 (three) times daily.      . cephALEXin (KEFLEX) 500 MG capsule Take 500 mg by mouth 4 (four) times daily.      . clopidogrel (PLAVIX) 75 MG tablet Take 1 tablet (75 mg total) by mouth daily.  90 tablet  3  . diazepam (VALIUM) 5 MG tablet Take 1 tablet (5 mg total) by mouth every 6 (six) hours as needed for muscle spasms.  50 tablet  1  . diphenhydramine-acetaminophen (TYLENOL  PM) 25-500 MG TABS Take 2 tablets by mouth at bedtime.      [name] Sodium (DSS) 100 MG CAPS Take 100 mg by mouth daily as needed.      Marland Kitchen EPIPEN 2-PAK 0.3 MG/0.3ML SOAJ injection       . gabapentin (NEURONTIN) 400 MG capsule Take 1 capsule (400 mg total) by mouth 3 (three) times daily as needed.  90 capsule  1  . isosorbide mononitrate (IMDUR) 60 MG 24 hr tablet Take 1 tablet (60 mg total) by mouth daily.  90 tablet  3  . metoprolol succinate (TOPROL-XL) 50 MG 24 hr tablet Take 1 &1/2 tablets daily  135 tablet  3  . nitroGLYCERIN (NITROSTAT) 0.4 MG SL tablet Place 0.4 mg under the tongue as needed.      Marland Kitchen oxyCODONE-acetaminophen (PERCOCET) 10-325 MG per tablet Take 1 tablet by mouth every 4 (four) hours as needed for pain.  100 tablet  0  .  ramipril (ALTACE) 10 MG tablet Take 1 tablet (10 mg total) by mouth daily.  90 tablet  3  . Vitamin D, Ergocalciferol, (DRISDOL) 50000 UNITS CAPS capsule Take 50,000 Units by mouth every 7 (seven) days.       No current facility-administered medications for this visit.    Socially he is divorced. There is a remote tobacco history. He does try to exercise but he has been limited by his mild residual back discomfort .  ROS General: Negative; No fevers, chills, or night sweats;  HEENT: Negative; No changes in vision or hearing, sinus congestion, difficulty swallowing Pulmonary: Negative; No cough, wheezing, shortness of breath, hemoptysis Cardiovascular: Negative; No chest pain, presyncope, syncope, palpatations GI: Negative; No nausea, vomiting, diarrhea, or abdominal pain GU: History of robotic prostate removal secondary to prostate CA with residual urinary incontinence; difficulty with erectile function; No dysuria, hematuria, or difficulty voiding Musculoskeletal: Positive for lumbar disc disease, status post L4-L5 spinal fusion;  no myalgias, joint pain, or weakness Hematologic/Oncology: Negative; no easy bruising, bleeding Endocrine: Probable metabolic syndrome; no heat/cold intolerance; no diabetes Neuro: Negative; no changes in balance, headaches Skin: Negative; No rashes or skin lesions Psychiatric: Negative; No behavioral problems, depression Sleep: Negative; No snoring, daytime sleepiness, hypersomnolence, bruxism, restless legs, hypnogognic hallucinations, no cataplexy Other comprehensive 14 point system review is negative.   PE BP 122/64  Pulse 69  Ht 5\' 8"  (1.727 m)  Wt 219 lb 12.8 oz (99.701 kg)  BMI 33.43 kg/m2  General: Alert, oriented, no distress.  Skin: normal turgor, no rashes HEENT: Normocephalic, atraumatic. Pupils round and reactive; sclera anicteric;no lid lag.  Nose without nasal septal hypertrophy Mouth/Parynx benign; Mallinpatti scale 2 Neck: No JVD, no  carotid bruits; normal upstroke Lungs: clear to ausculatation and percussion; no wheezing or rales Chest wall: No tenderness to palpation Heart: RRR, s1 s2 normal no S3 or S4 gallops; 1/6 systolic murmur.  No diastolic murmur, rubs, thrills or heaves Abdomen: soft, nontender; no hepatosplenomehaly, BS+; abdominal aorta nontender and not dilated by palpation. Back: No CVA tenderness Pulses 2+ Extremities: no clubbing cyanosis or edema, Homan's sign negative  Neurologic: grossly nonfocal Psychological: Normal affect and mood  ECG (independently read by me): Normal sinus rhythm at 69 beats per minute.  No significant ST-T change  ECG: Sinus rhythm at 70beats per minute. No ectopy. Intervals normal.  LABS:  BMET    Component Value Date/Time   NA 140 02/24/2014 1105   K 4.4 02/24/2014 1105   CL 104 02/24/2014 1105  CO2 29 02/24/2014 1105   GLUCOSE 112* 02/24/2014 1105   BUN 22 02/24/2014 1105   CREATININE 1.15 02/24/2014 1105   CREATININE 1.00 08/08/2013 0430   CALCIUM 9.5 02/24/2014 1105   GFRNONAA 82* 08/08/2013 0430   GFRAA >90 08/08/2013 0430     Hepatic Function Panel     Component Value Date/Time   PROT 6.5 02/24/2014 1105     CBC    Component Value Date/Time   WBC 7.7 02/24/2014 1107   RBC 4.52 02/24/2014 1107   HGB 13.3 02/24/2014 1107   HCT 39.3 02/24/2014 1107   PLT 205 02/24/2014 1107   MCV 86.9 02/24/2014 1107   MCH 29.4 02/24/2014 1107   MCHC 33.8 02/24/2014 1107   RDW 14.3 02/24/2014 1107     BNP No results found for this basename: probnp    Lipid Panel     Component Value Date/Time   CHOL 134 02/24/2014 1109     RADIOLOGY: No results found.    ASSESSMENT AND PLAN: SeanKozakiewicz  is s/p remote intervention to his right coronary artery, circumflex vessel, as well as diagonal vessels.  He is without recurrent anginal symptoms on his current medical regimen.  He continues to take isosorbide mononitrate 60 mg, amlodipine 10 mg, Toprol-XL 75 mg daily , and also takes a ramipril 10  mg for ACE-I.  His blood pressure is well-controlled on this regimen. He is has continued to be on dual antiplatelet therapy with aspirin and Plavix with his multiple stents.  I did review with him recent blood work that he had done on 02/24/2014.  He has been on atorvastatin 80 mg.  Cholesterol was 134, triglycerides 142, LDL cholesterol 69.  HDL remains low at 37.  He does have probable metabolic syndrome.  His fasting glucose was 112.  Renal function was normal.  LFTs were normal.  His ECG today is essentially unchanged.  We did discuss the importance of weight loss and to try to increase exercise if possible.  We also discussed potential swimming.  I will see him in 6 months for reevaluation and further recommendations will be made at that time.  They will contact us in the interim if problems arise.   Troy Sine, MD, [facility]  03/10/2014 9:34 AM

## 2014-03-10 NOTE — Patient Instructions (Signed)
Your physician recommends that you schedule a follow-up appointment in: 6 months. No changes were made today in your therapy. 

## 2014-06-30 ENCOUNTER — Other Ambulatory Visit: Payer: Self-pay | Admitting: Physical Medicine & Rehabilitation

## 2014-08-20 ENCOUNTER — Other Ambulatory Visit: Payer: Self-pay | Admitting: Neurosurgery

## 2014-08-20 DIAGNOSIS — M545 Low back pain, unspecified: Secondary | ICD-10-CM

## 2014-08-20 DIAGNOSIS — G8929 Other chronic pain: Secondary | ICD-10-CM

## 2014-09-01 ENCOUNTER — Telehealth: Payer: Self-pay | Admitting: Cardiovascular Disease

## 2014-09-01 NOTE — Telephone Encounter (Signed)
Sean Fritz  Please have TK address this on Tuesday

## 2014-09-01 NOTE — Telephone Encounter (Signed)
Sean Fritz is calling to stop his Plavix 5 before he can a procedure done . Please call    Thanks

## 2014-09-05 ENCOUNTER — Telehealth: Payer: Self-pay | Admitting: Cardiology

## 2014-09-05 NOTE — Telephone Encounter (Signed)
Left message on voice mail to Sean Fritz No signs if question was asked of Dr Claiborne Billings. Patient needs lumbar myleogram -hold plavix Will have to route message to Dr Claiborne Billings

## 2014-09-05 NOTE — Telephone Encounter (Signed)
Left message on Andee Poles 'S voice mail Can hold for 6 days prior myelogram Information can be obtain in epic

## 2014-09-05 NOTE — Telephone Encounter (Signed)
Sean Fritz is calling in because she contacted Dr. Claiborne Billings a couple of weeks ago in regards to the pt needing a Lumbar milogram. She would like to know some instructions on the pt going off of his plavis to have this procedure done. Please call  Thanks

## 2014-09-05 NOTE — Telephone Encounter (Signed)
Patient has appointment 1214/15 with Dr. Claiborne Billings.

## 2014-09-05 NOTE — Telephone Encounter (Signed)
Text message sent to Dr. Claiborne Billings to please address.

## 2014-09-05 NOTE — Telephone Encounter (Signed)
Hold plavix and ASA for a  of 6 days prior to myelogram

## 2014-09-08 ENCOUNTER — Encounter: Payer: Self-pay | Admitting: Cardiovascular Disease

## 2014-09-08 ENCOUNTER — Ambulatory Visit (INDEPENDENT_AMBULATORY_CARE_PROVIDER_SITE_OTHER): Payer: Medicare Other | Admitting: Cardiovascular Disease

## 2014-09-08 VITALS — BP 122/74 | HR 62 | Ht 68.0 in | Wt 222.0 lb

## 2014-09-08 DIAGNOSIS — Z79899 Other long term (current) drug therapy: Secondary | ICD-10-CM

## 2014-09-08 DIAGNOSIS — E785 Hyperlipidemia, unspecified: Secondary | ICD-10-CM

## 2014-09-08 DIAGNOSIS — I10 Essential (primary) hypertension: Secondary | ICD-10-CM

## 2014-09-08 DIAGNOSIS — I2510 Atherosclerotic heart disease of native coronary artery without angina pectoris: Secondary | ICD-10-CM

## 2014-09-08 DIAGNOSIS — M47817 Spondylosis without myelopathy or radiculopathy, lumbosacral region: Secondary | ICD-10-CM

## 2014-09-08 NOTE — Progress Notes (Signed)
Patient ID: Sean Fritz, male   DOB: 08/02/1956, 58 y.o.   MRN: [id number]     HPI: Sean Fritz is a 58 y.o. male with known coronary artery disease who presents to the office today for followup cardiology evaluation prior to planned myelogram.   Sean Fritz has known CAD and suffered a non-Q-wave MI and underwent PTCA of his mid distal RCA as well as proximal circumflex coronary artery in January 1998. In July 1998 he developed restenosis of his proximal RCA and underwent stenting of his right coronary artery. In March 2011 he underwent stenting of a 90% diagonal stenosis. Additional problems include mixed hyperlipidemia with low HDL levels and high triglycerides consistent with an atherogenic dyslipidemia pattern, hypertension, degenerative joint disease, as well as mild obesity.  On11/08/2013 he underwent L4-L5 redo laminectomy and has noticed significant resolution in some of his previous low extremity symptomatology. His back pain initially improved which resulted in improved ambulation.  He does have issues with urinary incontinence and there is remote history of prostate CA. He tolerated his back surgery from a cardiac standpoint without compromise.   I seen him last 6 months ago at which time he was unable to exercise regularly due to some recurrent back issues.  Laboratory was obtained at that time which revealed good control of his lipid status with a total cholesterol of 134 and LDL cholesterol of 69.  However, HDL cholesterol was still mildly low at 37.  Recently, he has noticed hip discomfort.  He is followed by Dr. Clayton Bibles.  Due to his back and hip discomfort.  He will be undergoing a myelogram for further evaluation.  He denies recent chest pain.  He denies shortness of breath.  He presents for evaluation.  Past Medical History  Diagnosis Date  . Hypertension   . Depression   . Mixed hyperlipidemia   . Degenerative joint disease   . Cancer     Prostate   . Myocardial  infarction 1998     stress Lexiscan Normal; Low risk scan. EF is 58%. Echo 12/07/09/ normal EF > 55%   . Inability to control urination     d/t prostate surgery; pt wears pad  . Constipation due to pain medication   . Rosacea     on nose  . Coronary artery disease     Past Surgical History  Procedure Laterality Date  . Coronary angioplasty with stent placement  1998 & 12/01/09    2.25 x7mm Taxus Atom Liberte DES stent-diagnal vessel with cutting balloon arthrotomy.  . Back surgery  Nov 2009  . Prostate surgery    . Elbow surgery Left July 2010  . Tonsillectomy      Allergies  Allergen Reactions  . No Known Drug Allergy     Current Outpatient Prescriptions  Medication Sig Dispense Refill  . amLODipine (NORVASC) 10 MG tablet Take 1 tablet (10 mg total) by mouth daily. 90 tablet 3  . aspirin 81 MG tablet Take 81 mg by mouth daily.     Marland Kitchen atorvastatin (LIPITOR) 80 MG tablet Take 1 tablet (80 mg total) by mouth daily. 90 tablet 3  . buPROPion (WELLBUTRIN XL) 150 MG 24 hr tablet Take 150 mg by mouth 3 (three) times daily.    . clopidogrel (PLAVIX) 75 MG tablet Take 1 tablet (75 mg total) by mouth daily. 90 tablet 3  . diazepam (VALIUM) 5 MG tablet Take 5 mg by mouth every 8 (eight) hours as needed.    Marland Kitchen  diphenhydramine-acetaminophen (TYLENOL PM) 25-500 MG TABS Take 2 tablets by mouth at bedtime.    [name] Sodium (DSS) 100 MG CAPS Take 100 mg by mouth daily as needed.    Marland Kitchen EPIPEN 2-PAK 0.3 MG/0.3ML SOAJ injection     . gabapentin (NEURONTIN) 400 MG capsule Take 1 capsule (400 mg  total) by mouth 3 (three)  times daily as needed. 180 capsule 3  . HYDROcodone-acetaminophen (NORCO) 10-325 MG per tablet Take 1 tablet by mouth 4 (four) times daily.    . isosorbide mononitrate (IMDUR) 60 MG 24 hr tablet Take 1 tablet (60 mg total) by mouth daily. 90 tablet 3  . metoprolol succinate (TOPROL-XL) 50 MG 24 hr tablet Take 1 &1/2 tablets daily 135 tablet 3  . nitroGLYCERIN (NITROSTAT) 0.4 MG  SL tablet Place 0.4 mg under the tongue as needed.    Marland Kitchen PROCTOSOL HC 2.5 % rectal cream Apply 1 application topically as needed.    . ramipril (ALTACE) 10 MG capsule Take 1 capsule (10 mg total) by mouth daily. 90 capsule 3  . Vitamin D, Ergocalciferol, (DRISDOL) 50000 UNITS CAPS capsule Take 50,000 Units by mouth every 7 (seven) days.     No current facility-administered medications for this visit.    Socially he is divorced. There is a remote tobacco history. He does try to exercise but he has been limited by his mild residual back discomfort .  ROS General: Negative; No fevers, chills, or night sweats;  HEENT: Negative; No changes in vision or hearing, sinus congestion, difficulty swallowing Pulmonary: Negative; No cough, wheezing, shortness of breath, hemoptysis Cardiovascular: Negative; No chest pain, presyncope, syncope, palpatations GI: Negative; No nausea, vomiting, diarrhea, or abdominal pain GU: History of robotic prostate removal secondary to prostate CA with residual urinary incontinence; difficulty with erectile function; No dysuria, hematuria, or difficulty voiding Musculoskeletal: Positive for lumbar disc disease, status post L4-L5 spinal fusion; positive for hip discomfort  Hematologic/Oncology: Negative; no easy bruising, bleeding Endocrine: Probable metabolic syndrome; no heat/cold intolerance; no diabetes Neuro: Negative; no changes in balance, headaches Skin: Negative; No rashes or skin lesions Psychiatric: Negative; No behavioral problems, depression Sleep: Negative; No snoring, daytime sleepiness, hypersomnolence, bruxism, restless legs, hypnogognic hallucinations, no cataplexy Other comprehensive 14 point system review is negative.   PE BP 122/74 mmHg  Pulse 62  Ht 5\' 8"  (1.727 m)  Wt 222 lb (100.699 kg)  BMI 33.76 kg/m2  General: Alert, oriented, no distress.  Skin: normal turgor, no rashes HEENT: Normocephalic, atraumatic. Pupils round and reactive; sclera  anicteric;no lid lag.  Nose without nasal septal hypertrophy Mouth/Parynx benign; Mallinpatti scale 2 Neck: No JVD, no carotid bruits; normal upstroke Lungs: clear to ausculatation and percussion; no wheezing or rales Chest wall: No tenderness to palpation Heart: RRR, s1 s2 normal no S3 or S4 gallops; 1/6 systolic murmur.  No diastolic murmur, rubs, thrills or heaves Abdomen: soft, nontender; no hepatosplenomehaly, BS+; abdominal aorta nontender and not dilated by palpation. Back: No CVA tenderness Pulses 2+ Extremities: no clubbing cyanosis or edema, Homan's sign negative  Neurologic: grossly nonfocal Psychological: Normal affect and mood  ECG (independently read by me): normal sinus rhythm at 62 bpm.  PR interval 180 ms.  QTc interval 420 ms.  June 2015 ECG (independently read by me): Normal sinus rhythm at 69 beats per minute.  No significant ST-T change  ECG: Sinus rhythm at 70 beats per minute. No ectopy. Intervals normal.  LABS:  BMET    Component Value Date/Time  NA 140 02/24/2014 1105   K 4.4 02/24/2014 1105   CL 104 02/24/2014 1105   CO2 29 02/24/2014 1105   GLUCOSE 112* 02/24/2014 1105   BUN 22 02/24/2014 1105   CREATININE 1.15 02/24/2014 1105   CREATININE 1.00 08/08/2013 0430   CALCIUM 9.5 02/24/2014 1105   GFRNONAA 82* 08/08/2013 0430   GFRAA >90 08/08/2013 0430     Hepatic Function Panel     Component Value Date/Time   PROT 6.5 02/24/2014 1105     CBC    Component Value Date/Time   WBC 7.7 02/24/2014 1107   RBC 4.52 02/24/2014 1107   HGB 13.3 02/24/2014 1107   HCT 39.3 02/24/2014 1107   PLT 205 02/24/2014 1107   MCV 86.9 02/24/2014 1107   MCH 29.4 02/24/2014 1107   MCHC 33.8 02/24/2014 1107   RDW 14.3 02/24/2014 1107     BNP No results found for: PROBNP  Lipid Panel     Component Value Date/Time   CHOL 134 02/24/2014 1109   TRIG 142 02/24/2014 1109   HDL 37* 02/24/2014 1109   CHOLHDL 3.6 02/24/2014 1109   VLDL 28 02/24/2014 1109    LDLCALC 69 02/24/2014 1109     RADIOLOGY: No results found.    ASSESSMENT AND PLAN: SeanFreeland  is a 12 -year-old white male who has established CAD and is s/p remote intervention to his right coronary artery, circumflex vessel, as well as diagonal vessels.  He is without recurrent anginal symptoms on his current medical regimen.  He continues to take isosorbide mononitrate 60 mg, amlodipine 10 mg, Toprol-XL 75 mg daily , and also takes a ramipril 10 mg for ACE-I.  His blood pressure is well-controlled on this regimen. He is has continued to be on dual antiplatelet therapy with aspirin and Plavix with his multiple stents.  He is now develop progressive hip discomfort and some paresthesias to his thighs.  He will be undergoing a myelogram for further evaluation of this discomfort.  I have recommended that he hold his aspirin for 5 days and hold his Plavix for 6 days prior to this procedure.  He is without anginal symptoms.  His last stent was placed in 2011.  I suspect he will require a follow-up chemistry evaluation prior to contrast and coagulation laboratory.  I will defer this to Dr. Arnoldo Morale for this to be done depending upon the timing of his testing.  Cardiovascularly, he is stable on current therapy.  I will see him in 6 months for reevaluation or sooner if problem arise.  Troy Sine, MD, [facility]  09/08/2014 3:31 PM

## 2014-09-08 NOTE — Patient Instructions (Signed)
Your physician wants you to follow-up in: 6 months or sooner if needed. No changes were made today in your therapy.You will receive a reminder letter in the mail two months in advance. If you don't receive a letter, please call our office to schedule the follow-up appointment.  

## 2014-09-23 ENCOUNTER — Ambulatory Visit
Admission: RE | Admit: 2014-09-23 | Discharge: 2014-09-23 | Disposition: A | Discharge status: Home / Self Care | Payer: Medicare Other | Source: Ambulatory Visit | Attending: Neurosurgery | Admitting: Neurosurgery

## 2014-09-23 DIAGNOSIS — M545 Low back pain, unspecified: Secondary | ICD-10-CM

## 2014-09-23 DIAGNOSIS — G8929 Other chronic pain: Secondary | ICD-10-CM

## 2014-09-23 MED ORDER — IOHEXOL 180 MG/ML  SOLN
15.0000 mL | Freq: Once | INTRAMUSCULAR | Status: AC | PRN
  Administered 2014-09-23: 15 mL via INTRATHECAL

## 2014-09-23 MED ORDER — DIAZEPAM 5 MG PO TABS
10.0000 mg | ORAL_TABLET | Freq: Once | ORAL | Status: AC
  Administered 2014-09-23: 10 mg via ORAL

## 2014-09-23 MED ORDER — ONDANSETRON HCL 4 MG/2ML IJ SOLN: 4.0000 mg | Freq: Four times a day (QID) | INTRAMUSCULAR | Status: DC | PRN

## 2014-09-23 NOTE — Progress Notes (Signed)
Pt states he has been off Wellbutrin and Plavix for the past 7 days. Also, off ASA for the past 3 days. Discharge instructions explained to pt.

## 2014-09-23 NOTE — Discharge Instructions (Signed)
Myelogram Discharge Instructions  1. Go home and rest quietly for the next 24 hours.  It is important to lie flat for the next 24 hours.  Get up only to go to the restroom.  You may lie in the bed or on a couch on your back, your stomach, your left side or your right side.  You may have one pillow under your head.  You may have pillows between your knees while you are on your side or under your knees while you are on your back.  2. DO NOT drive today.  Recline the seat as far back as it will go, while still wearing your seat belt, on the way home.  3. You may get up to go to the bathroom as needed.  You may sit up for 10 minutes to eat.  You may resume your normal diet and medications unless otherwise indicated.  Drink lots of extra fluids today and tomorrow.  4. The incidence of headache, nausea, or vomiting is about 5% (one in 20 patients).  If you develop a headache, lie flat and drink plenty of fluids until the headache goes away.  Caffeinated beverages may be helpful.  If you develop severe nausea and vomiting or a headache that does not go away with flat bed rest, call [phone].  5. You may resume normal activities after your 24 hours of bed rest is over; however, do not exert yourself strongly or do any heavy lifting tomorrow. If when you get up you have a headache when standing, go back to bed and force fluids for another 24 hours.  6. Call your physician for a follow-up appointment.  The results of your myelogram will be sent directly to your physician by the following day.  7. If you have any questions or if complications develop after you arrive home, please call [phone].  Discharge instructions have been explained to the patient.  The patient, or the person responsible for the patient, fully understands these instructions.      MAY RESUME PLAVIX TODAY.  May resume Wellbutrin on Dec. 30, 2015, after 11:00 am.

## 2014-12-10 ENCOUNTER — Other Ambulatory Visit: Payer: Self-pay | Admitting: Cardiovascular Disease

## 2015-02-16 ENCOUNTER — Encounter: Payer: Self-pay | Admitting: Cardiovascular Disease

## 2015-02-20 ENCOUNTER — Encounter: Payer: Self-pay | Admitting: *Deleted

## 2015-02-20 ENCOUNTER — Other Ambulatory Visit: Payer: Self-pay | Admitting: *Deleted

## 2015-02-20 DIAGNOSIS — I2510 Atherosclerotic heart disease of native coronary artery without angina pectoris: Secondary | ICD-10-CM

## 2015-02-20 DIAGNOSIS — Z79899 Other long term (current) drug therapy: Secondary | ICD-10-CM

## 2015-02-20 DIAGNOSIS — E785 Hyperlipidemia, unspecified: Secondary | ICD-10-CM

## 2015-02-20 DIAGNOSIS — I10 Essential (primary) hypertension: Secondary | ICD-10-CM

## 2015-03-28 LAB — CBC
HEMATOCRIT: 41.7 % (ref 39.0–52.0)
Hemoglobin: 13.8 g/dL (ref 13.0–17.0)
MCH: 29.4 pg (ref 26.0–34.0)
MCHC: 33.1 g/dL (ref 30.0–36.0)
MCV: 88.9 fL (ref 78.0–100.0)
MPV: 9.9 fL (ref 8.6–12.4)
PLATELETS: 206 10*3/uL (ref 150–400)
RBC: 4.69 MIL/uL (ref 4.22–5.81)
RDW: 13.9 % (ref 11.5–15.5)
WBC: 7.3 10*3/uL (ref 4.0–10.5)

## 2015-03-28 LAB — COMPREHENSIVE METABOLIC PANEL
ALBUMIN: 4.3 g/dL (ref 3.5–5.2)
ALK PHOS: 90 U/L (ref 39–117)
ALT: 22 U/L (ref 0–53)
AST: 14 U/L (ref 0–37)
BUN: 25 mg/dL — AB (ref 6–23)
CHLORIDE: 107 meq/L (ref 96–112)
CO2: 23 meq/L (ref 19–32)
CREATININE: 1.41 mg/dL — AB (ref 0.50–1.35)
Calcium: 8.7 mg/dL (ref 8.4–10.5)
Glucose, Bld: 103 mg/dL — ABNORMAL HIGH (ref 70–99)
POTASSIUM: 4.2 meq/L (ref 3.5–5.3)
SODIUM: 141 meq/L (ref 135–145)
TOTAL PROTEIN: 6.2 g/dL (ref 6.0–8.3)
Total Bilirubin: 0.5 mg/dL (ref 0.2–1.2)

## 2015-03-28 LAB — LIPID PANEL
CHOL/HDL RATIO: 3.9 ratio
CHOLESTEROL: 116 mg/dL (ref 0–200)
HDL: 30 mg/dL — AB (ref >=40)
LDL CALC: 49 mg/dL (ref 0–99)
TRIGLYCERIDES: 185 mg/dL — AB (ref <150)
VLDL: 37 mg/dL (ref 0–40)

## 2015-03-28 LAB — TSH: TSH: 1.71 u[IU]/mL (ref 0.350–4.500)

## 2015-04-02 ENCOUNTER — Encounter: Payer: Self-pay | Admitting: *Deleted

## 2015-04-02 ENCOUNTER — Telehealth: Payer: Self-pay | Admitting: *Deleted

## 2015-04-02 ENCOUNTER — Other Ambulatory Visit: Payer: Self-pay | Admitting: *Deleted

## 2015-04-02 DIAGNOSIS — R899 Unspecified abnormal finding in specimens from other organs, systems and tissues: Secondary | ICD-10-CM

## 2015-04-02 NOTE — Telephone Encounter (Signed)
-----  Message from Troy Sine, MD sent at 03/29/2015  2:05 PM EDT ----- TG elevated to 185; Cr inc to 1.41; stay hydrated; re-check B met in 2 weeks, if Cr inc further will then decrease ramipril

## 2015-04-02 NOTE — Telephone Encounter (Signed)
Left message lab results and recommendations will be released into my chart for his review. Once reviewed if he has any questions or concerns please call me back.

## 2015-04-06 ENCOUNTER — Encounter: Payer: Self-pay | Admitting: Cardiovascular Disease

## 2015-04-06 ENCOUNTER — Ambulatory Visit (INDEPENDENT_AMBULATORY_CARE_PROVIDER_SITE_OTHER): Payer: Medicare Other | Admitting: Cardiovascular Disease

## 2015-04-06 VITALS — BP 120/60 | HR 70 | Ht 68.0 in | Wt 214.7 lb

## 2015-04-06 DIAGNOSIS — I2510 Atherosclerotic heart disease of native coronary artery without angina pectoris: Secondary | ICD-10-CM

## 2015-04-06 DIAGNOSIS — E785 Hyperlipidemia, unspecified: Secondary | ICD-10-CM | POA: Diagnosis not present

## 2015-04-06 DIAGNOSIS — N289 Disorder of kidney and ureter, unspecified: Secondary | ICD-10-CM

## 2015-04-06 DIAGNOSIS — I10 Essential (primary) hypertension: Secondary | ICD-10-CM | POA: Diagnosis not present

## 2015-04-06 DIAGNOSIS — E669 Obesity, unspecified: Secondary | ICD-10-CM | POA: Diagnosis not present

## 2015-04-06 DIAGNOSIS — I2583 Coronary atherosclerosis due to lipid rich plaque: Principal | ICD-10-CM

## 2015-04-06 MED ORDER — RAMIPRIL 5 MG PO CAPS: 5.0000 mg | ORAL_CAPSULE | Freq: Every day | ORAL | Status: DC

## 2015-04-06 NOTE — Progress Notes (Signed)
Patient ID: Sean Fritz, male   DOB: 1956-09-11, 59 y.o.   MRN: [id number]     HPI: Sean Fritz is a 59 y.o. male with known coronary artery disease who presents to the office today for an 8 month followup cardiology evaluation.   Sean Fritz has known CAD and suffered a non-Q-wave MI and underwent PTCA of his mid distal RCA as well as proximal circumflex coronary artery in January 1998. In July 1998 he developed restenosis of his proximal RCA and underwent stenting of his right coronary artery. In March 2011 he underwent stenting of a 90% diagonal stenosis. Additional problems include mixed hyperlipidemia with low HDL levels and high triglycerides consistent with an atherogenic dyslipidemia pattern, hypertension, degenerative joint disease, as well as mild obesity.  On11/08/2013 he underwent L4-L5 redo laminectomy and has noticed significant resolution in some of his previous low extremity symptomatology. His back pain initially improved which resulted in improved ambulation.  He does have issues with urinary incontinence and there is remote history of prostate CA. He tolerated his back surgery from a cardiac standpoint without compromise.    last saw him he had been having difficulty with his hip.  He is followed by Dr. Clayton Bibles.  Due to his back and hip discomfort he underwent a myelogram.   Recently, he has had issues with urinary incontinence. He had been taking some medication from his urologist and also reduced his fluid intake.  Recent laboratory was done which had shown his creatinine had risen to 1.41 whereas one year ago this was 1.15.   He denies chest pain.  He denies palpitations.  He denies presyncope or syncope.  He presents for evaluation.    Past Medical History  Diagnosis Date  . Hypertension   . Depression   . Mixed hyperlipidemia   . Degenerative joint disease   . Cancer     Prostate   . Myocardial infarction 1998     stress Lexiscan Normal; Low risk scan. EF is  58%. Echo 12/07/09/ normal EF > 55%   . Inability to control urination     d/t prostate surgery; pt wears pad  . Constipation due to pain medication   . Rosacea     on nose  . Coronary artery disease     Past Surgical History  Procedure Laterality Date  . Coronary angioplasty with stent placement  1998 & 12/01/09    2.25 x32m Taxus Atom Liberte DES stent-diagnal vessel with cutting balloon arthrotomy.  . Back surgery  Nov 2009  . Prostate surgery    . Elbow surgery Left July 2010  . Tonsillectomy      Allergies  Allergen Reactions  . No Known Drug Allergy     Current Outpatient Prescriptions  Medication Sig Dispense Refill  . amLODipine (NORVASC) 10 MG tablet Take 1 tablet by mouth  daily 90 tablet 2  . aspirin 81 MG tablet Take 81 mg by mouth daily.     .Marland Kitchenatorvastatin (LIPITOR) 80 MG tablet Take 1 tablet (80 mg total) by mouth daily. 90 tablet 3  . buPROPion (WELLBUTRIN XL) 150 MG 24 hr tablet Take 150 mg by mouth 3 (three) times daily.    . clopidogrel (PLAVIX) 75 MG tablet Take 1 tablet by mouth  daily 90 tablet 2  . diazepam (VALIUM) 5 MG tablet Take 5 mg by mouth every 8 (eight) hours as needed.    . diphenhydramine-acetaminophen (TYLENOL PM) 25-500 MG TABS Take 2 tablets by  mouth at bedtime.    [name] Sodium (DSS) 100 MG CAPS Take 100 mg by mouth daily as needed.    Marland Kitchen EPIPEN 2-PAK 0.3 MG/0.3ML SOAJ injection     . HYDROcodone-acetaminophen (NORCO) 10-325 MG per tablet Take 1 tablet by mouth 4 (four) times daily.    . isosorbide mononitrate (IMDUR) 60 MG 24 hr tablet Take 1 tablet by mouth  daily 90 tablet 2  . metoprolol succinate (TOPROL-XL) 50 MG 24 hr tablet Take 1 &1/2 tablets daily 135 tablet 3  . nitroGLYCERIN (NITROSTAT) 0.4 MG SL tablet Place 0.4 mg under the tongue as needed.    Marland Kitchen PROCTOSOL HC 2.5 % rectal cream Apply 1 application topically as needed.    Marland Kitchen tiZANidine (ZANAFLEX) 4 MG tablet Take 1 tablet by mouth as needed.  0  . topiramate (TOPAMAX) 50 MG  tablet Take 50 mg by mouth 2 (two) times daily.    . Vitamin D, Ergocalciferol, (DRISDOL) 50000 UNITS CAPS capsule Take 50,000 Units by mouth every 7 (seven) days.    . ramipril (ALTACE) 5 MG capsule Take 1 capsule (5 mg total) by mouth daily. 90 capsule 3   No current facility-administered medications for this visit.    Socially he is divorced. There is a remote tobacco history. He does try to exercise but he has been limited by his mild residual back discomfort .  ROS General: Negative; No fevers, chills, or night sweats;  HEENT: Negative; No changes in vision or hearing, sinus congestion, difficulty swallowing Pulmonary: Negative; No cough, wheezing, shortness of breath, hemoptysis Cardiovascular: Negative; No chest pain, presyncope, syncope, palpatations GI: Negative; No nausea, vomiting, diarrhea, or abdominal pain GU: History of robotic prostate removal secondary to prostate CA with residual urinary incontinence; difficulty with erectile function; No dysuria, hematuria, or difficulty voiding Musculoskeletal: Positive for lumbar disc disease, status post L4-L5 spinal fusion; positive for hip discomfort  Hematologic/Oncology: Negative; no easy bruising, bleeding Endocrine: Probable metabolic syndrome; no heat/cold intolerance; no diabetes Neuro: Negative; no changes in balance, headaches Skin: Negative; No rashes or skin lesions Psychiatric: Negative; No behavioral problems, depression Sleep: Negative; No snoring, daytime sleepiness, hypersomnolence, bruxism, restless legs, hypnogognic hallucinations, no cataplexy Other comprehensive 14 point system review is negative.   PE BP 120/60 mmHg  Pulse 70  Ht '5\' 8"'  (1.727 m)  Wt 214 lb 11.2 oz (97.387 kg)  BMI 32.65 kg/m2  General: Alert, oriented, no distress.  Skin: normal turgor, no rashes HEENT: Normocephalic, atraumatic. Pupils round and reactive; sclera anicteric;no lid lag.  Nose without nasal septal hypertrophy Mouth/Parynx  benign; Mallinpatti scale 2 Neck: No JVD, no carotid bruits; normal upstroke Lungs: clear to ausculatation and percussion; no wheezing or rales Chest wall: No tenderness to palpation Heart: RRR, s1 s2 normal no S3 or S4 gallops; 1/6 systolic murmur.  No diastolic murmur, rubs, thrills or heaves Abdomen: soft, nontender; no hepatosplenomehaly, BS+; abdominal aorta nontender and not dilated by palpation. Back: No CVA tenderness Pulses 2+ Extremities: no clubbing cyanosis or edema, Homan's sign negative  Neurologic: grossly nonfocal Psychological: Normal affect and mood  ECG (independently read by me):  Normal sinus rhythm at 70 bpm..  No ectopy.  No significant ST changes.   December 2015ECG (independently read by me): normal sinus rhythm at 62 bpm.  PR interval 180 ms.  QTc interval 420 ms.  June 2015 ECG (independently read by me): Normal sinus rhythm at 69 beats per minute.  No significant ST-T change  ECG: Sinus rhythm  at 70 beats per minute. No ectopy. Intervals normal.  LABS: BMP Latest Ref Rng 03/27/2015 02/24/2014 08/08/2013  Glucose 70 - 99 mg/dL 103(H) 112(H) 120(H)  BUN 6 - 23 mg/dL 25(H) 22 16  Creatinine 0.50 - 1.35 mg/dL 1.41(H) 1.15 1.00  Sodium 135 - 145 mEq/L 141 140 137  Potassium 3.5 - 5.3 mEq/L 4.2 4.4 4.2  Chloride 96 - 112 mEq/L 107 104 100  CO2 19 - 32 mEq/L '23 29 25  ' Calcium 8.4 - 10.5 mg/dL 8.7 9.5 8.5   Hepatic Function Latest Ref Rng 03/27/2015 02/24/2014  Total Protein 6.0 - 8.3 g/dL 6.2 6.5  Albumin 3.5 - 5.2 g/dL 4.3 4.6  AST 0 - 37 U/L 14 19  ALT 0 - 53 U/L 22 27  Alk Phosphatase 39 - 117 U/L 90 104  Total Bilirubin 0.2 - 1.2 mg/dL 0.5 0.5   CBC Latest Ref Rng 03/27/2015 02/24/2014 08/10/2013  WBC 4.0 - 10.5 K/uL 7.3 7.7 12.6(H)  Hemoglobin 13.0 - 17.0 g/dL 13.8 13.3 10.5(L)  Hematocrit 39.0 - 52.0 % 41.7 39.3 29.5(L)  Platelets 150 - 400 K/uL 206 205 179   Lab Results  Component Value Date   MCV 88.9 03/27/2015   MCV 86.9 02/24/2014   MCV 87.8  08/10/2013   Lab Results  Component Value Date   TSH 1.710 03/27/2015   Lipid Panel     Component Value Date/Time   CHOL 116 03/27/2015 1118   TRIG 185* 03/27/2015 1118   HDL 30* 03/27/2015 1118   CHOLHDL 3.9 03/27/2015 1118   VLDL 37 03/27/2015 1118   LDLCALC 49 03/27/2015 1118      ASSESSMENT AND PLAN: SeanMccampbell  is a 15 -year-old white male who has established CAD and is s/p remote intervention to his right coronary artery, circumflex vessel, as well as diagonal vessels.  He is without recurrent anginal symptoms on his current medical regimen.  He continues to take isosorbide mononitrate 60 mg, amlodipine 10 mg, Toprol-XL 75 mg daily , and ramipril 10 mg for ACE-I.  His blood pressure is well-controlled on this regimen. He is has continued to be on dual antiplatelet therapy with aspirin and Plavix with his multiple stents.   Recent laboratory has shown that his creatinine had risen to 1.41.  I suspect this may have been contributed by some mild dehydration particular since he had cut his fluid intake in half. He has recently restarted to drink the same volume of fluid that he had previously. His blood pressure today is stable and on repeat by me was 122/70 on his medical regimen.  I am recommending reduction of his ramipril dose to 5 mg.  His follow-up be met will be obtained in a proximally 2-3 weeks for further evaluation. His lipid studies reveal an LDL cholesterol at 49, but triglycerides were elevated at 185 and HDL remains low at 30.  He states that due to laziness.  He has been eating TV dinners more often than he has in the past. We discussed improved diet. He is followed by urology for his urinary incontinence status post prostate CA.  His weight today is 214, which is an 8 pound weight loss from December 2015. BMI is still compatible with mild obesity and further weight loss was recommended.  I will see him in 6 months for reevaluation or sooner if problem arise.  Troy Sine,  MD, [facility]  04/06/2015 5:16 PM

## 2015-04-06 NOTE — Patient Instructions (Addendum)
Your physician has recommended you make the following change in your medication: decrease the ramipril to 5 mg daily.  Your physician recommends that you return for lab work in: 2-3 weeks after starting the 5 mg ramipril.  Your physician recommends that you schedule a follow-up appointment in: 6 months or sooner if needed.

## 2015-04-27 LAB — BASIC METABOLIC PANEL
BUN: 23 mg/dL (ref 7–25)
CALCIUM: 9.3 mg/dL (ref 8.6–10.3)
CO2: 22 mmol/L (ref 20–31)
Chloride: 108 mmol/L (ref 98–110)
Creat: 1.19 mg/dL (ref 0.70–1.33)
GLUCOSE: 111 mg/dL — AB (ref 65–99)
Potassium: 4.1 mmol/L (ref 3.5–5.3)
Sodium: 141 mmol/L (ref 135–146)

## 2015-07-28 IMAGING — CT CT L SPINE W/ CM
2 of 10 series · 5 of 20 positions shown, 6 images · non-contrast
Comparison: MRI 01/15/2014.  CT 03/18/2014.

CLINICAL DATA: Low back pain. Prior lumbar spine operation with
persistent back pain. Lumbar spine fusion. Assess hardware and
recurrent stenosis.
TECHNIQUE: Contiguous axial images were obtained through the Lumbar spine after
the intrathecal infusion of infusion. Coronal and sagittal
reconstructions were obtained of the axial image sets.

[Series 2: l spine bone · axial · 0.27mm/px · z∈[-229,-146]mm · 2 of 100 slices shown, 3 images]
[im 34/100  soft-tissue]
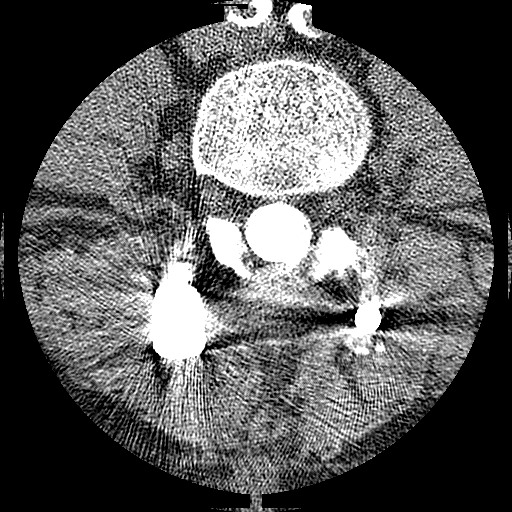
[im 34/100  bone]
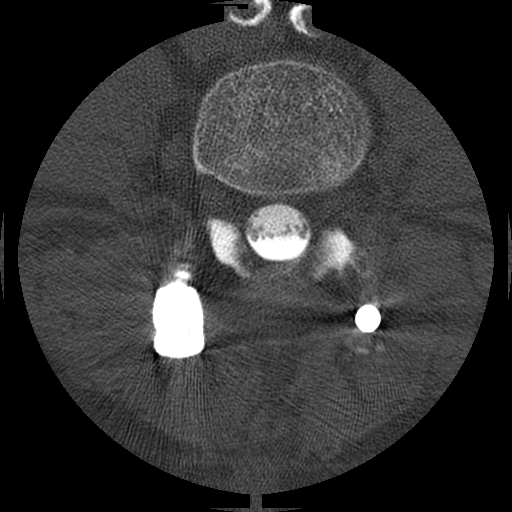
[im 67/100  bone]
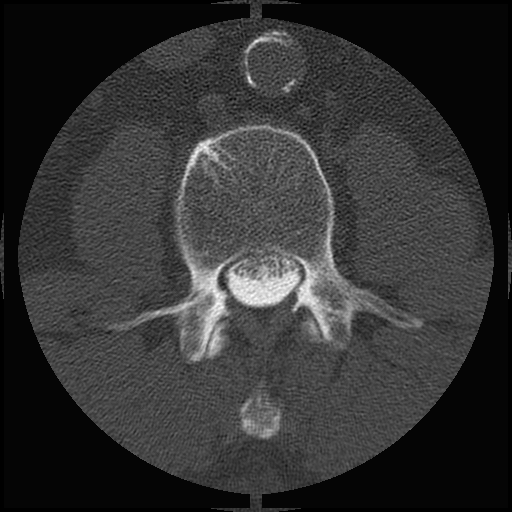

[Series 3: l spine soft · axial · 0.27mm/px · z∈[-251,-126]mm · 3 of 100 slices shown]
[im 25/100  soft-tissue]
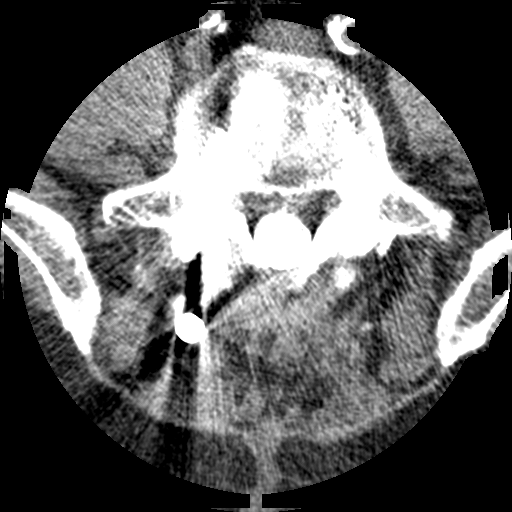
[im 50/100  soft-tissue]
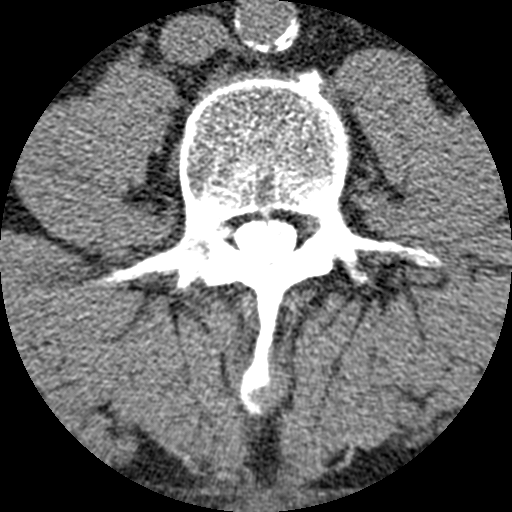
[im 75/100  soft-tissue]
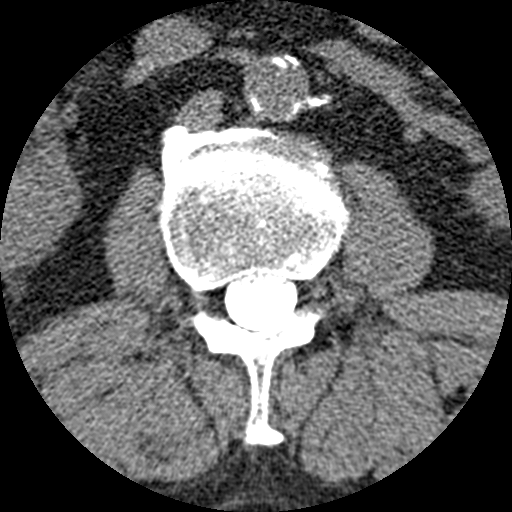

[5 of 20 positions shown; findings below may reference images not displayed]

EXAM:
LUMBAR MYELOGRAM

FLUOROSCOPY TIME:  0 min 44 seconds

PROCEDURE:
After thorough discussion of risks and benefits of the procedure
including bleeding, infection, injury to nerves, blood vessels,
adjacent structures as well as headache and CSF leak, written and
oral informed consent was obtained. Consent was obtained by Dr.
Belarmino Akoto. Time out form was completed.

Patient was positioned prone on the fluoroscopy table. Local
anesthesia was provided with 1% lidocaine without epinephrine after
prepped and draped in the usual sterile fashion. Puncture was
performed at L2-L3 (to avoid fluid collection seen on prior MRI)
using a 3 1/2 inch 22-gauge spinal needle via 22 gauge pencil point
Abdelrhaman spinal needle approach. Using a single pass through the
dura, the needle was placed within the thecal sac, with return of
clear CSF. 15 mL of Tmnipaque-ASW was injected into the thecal sac,
with normal opacification of the nerve roots and cauda equina
consistent with free flow within the subarachnoid space.

I personally performed the lumbar puncture and administered the
intrathecal contrast. I also personally supervised acquisition of
the myelogram images.
FINDINGS: LUMBAR MYELOGRAM FINDINGS:

L4-L5 posterior rod and pedicle screw fixation is present. The
alignment on AP upright images is normal. There is no visible
stenosis on plain films of the lumbar spine. The alignment is
anatomic on the lateral view in the neutral position and there is no
pathologic motion with flexion and extension. The lumbar spine range
of motion is poor, with most of the flexion and extension occurring
at the hips rather than at the lumbar spine. No evidence of
pseudoarthrosis. Lateral recesses appear adequately patent.

CT LUMBAR MYELOGRAM FINDINGS:

Segmentation: The numbering convention used for this exam termed
L5-S1 as the last intervertebral disc space. Numbering used on prior
exam preserved.

Alignment:  Anatomic.

Vertebrae: No destructive osseous lesions. Vertebral body height
preserved.

Conus medullaris: Normal termination at T12-L1.

Paraspinal tissues: Aortoiliac atherosclerosis. Partial ankylosis of
both SI joints.

Disc levels:

T11-T12:  Mild disc degeneration but no stenosis.

T12-L1: Mild facet degeneration. No stenosis. Disc appears normal.

L1-L2: Mild disc degeneration with anterior osteophytes, nearly
bridging and the RIGHT anterior region. No stenosis.

L2-L3: Mild bilateral facet arthrosis. Shallow circumferential disc
bulging. The central canal, lateral recesses and neural foramina
appear patent.

L3-L4: Shallow posterior disc bulging without stenosis. Both neural
foramina appear patent. Mild bilateral facet arthrosis. Mild central
stenosis secondary to bulging disc and posterior ligamentum flavum
redundancy. The subarticular zones appear normal and patent.

L4-L5: Good posterior decompression. The L4 and L5 pedicle screws
are in good position without hardware loosening or failure. The
interbody bone graft shows incorporation with some bridging bone
across the disc space. There is no evidence of the pseudoarthrosis.
Both neural foramina appear patent.

L5-S1: Shallow disc bulge with calcifications centrally. No
resulting stenosis. Both neural foramina appear patent. Central
canal and lateral recesses are patent.
IMPRESSION: LUMBAR MYELOGRAM IMPRESSION:

1. Technically successful lumbar puncture for lumbar myelogram.
2. No complicating features of L4-L5 fusion.
3. Suboptimal range of motion on flexion and extension but no
instability.

CT LUMBAR MYELOGRAM IMPRESSION:

1. Uncomplicated L4-L5 PLIF with good decompression. No recurrent
stenosis. Interbody graft is incorporating as expected.
2. Mild lumbar spine degenerative disease at other levels without
focal stenosis.

## 2015-07-28 IMAGING — RF DG MYELOGRAPHY LUMBAR INJ LUMBOSACRAL
13 of 19 series · 13 of 19 positions shown · non-contrast
Comparison: MRI 01/15/2014.  CT 03/18/2014.

CLINICAL DATA: Low back pain. Prior lumbar spine operation with
persistent back pain. Lumbar spine fusion. Assess hardware and
recurrent stenosis.
TECHNIQUE: Contiguous axial images were obtained through the Lumbar spine after
the intrathecal infusion of infusion. Coronal and sagittal
reconstructions were obtained of the axial image sets.

[Series 1: (hospital) · 1 of 1 slices shown]
[im 1/1]
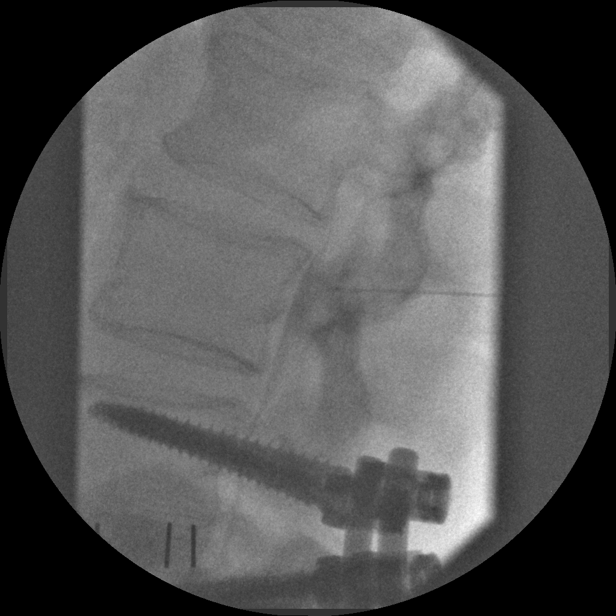

[Series 4: myelogram  white · 1 of 1 slices shown (1 of 9)]
[im 1/1]
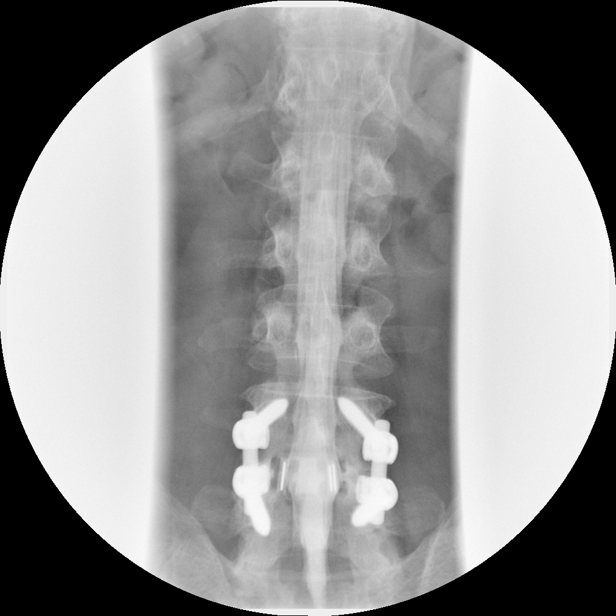

[Series 5: myelogram  white · 1 of 1 slices shown (2 of 9)]
[im 1/1]
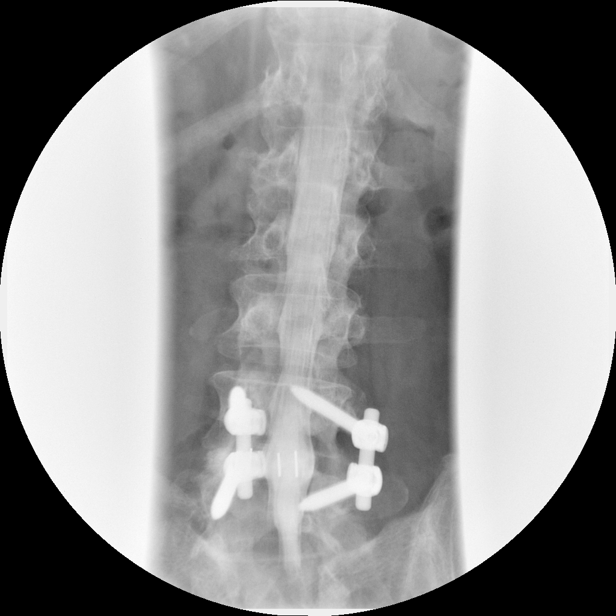

[Series 7: myelogram  white · 1 of 1 slices shown (3 of 9)]
[im 1/1]
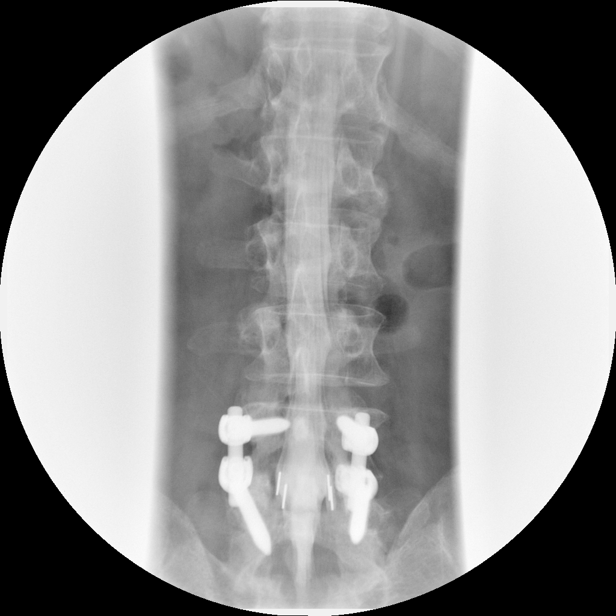

[Series 8: myelogram  white · 1 of 1 slices shown (4 of 9)]
[im 1/1]
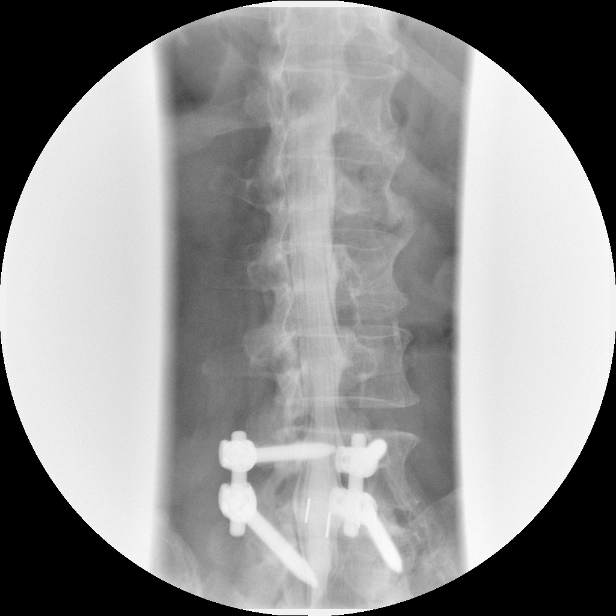

[Series 10: myelogram  white · 1 of 1 slices shown (5 of 9)]
[im 1/1]
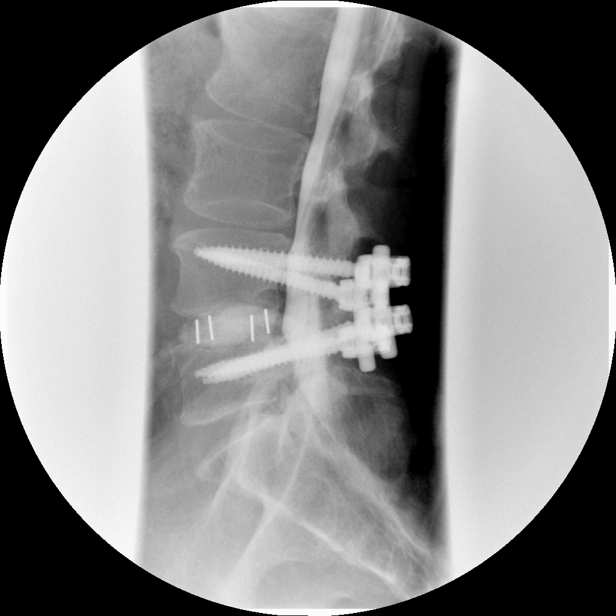

[Series 11: myelogram  white · 1 of 1 slices shown (6 of 9)]
[im 1/1]
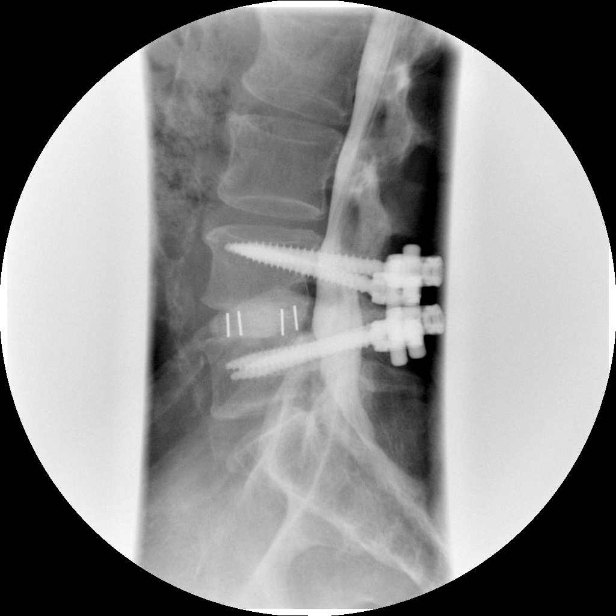

[Series 13: myelogram  white · 1 of 1 slices shown (7 of 9)]
[im 1/1]
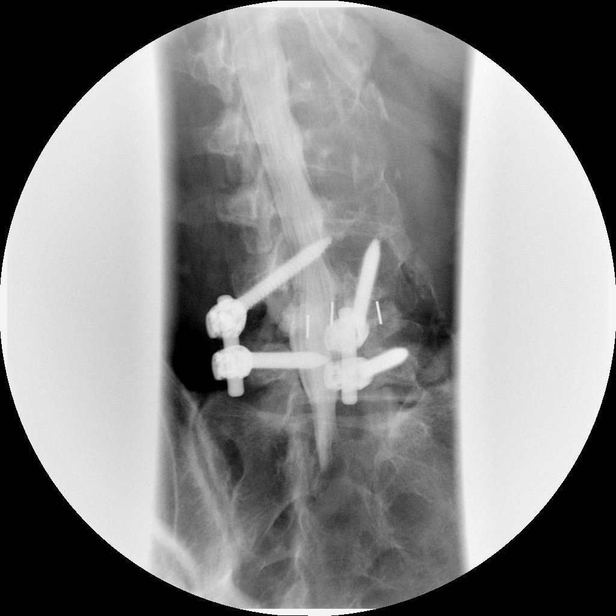

[Series 15: myelogram  white · 1 of 1 slices shown (8 of 9)]
[im 1/1]
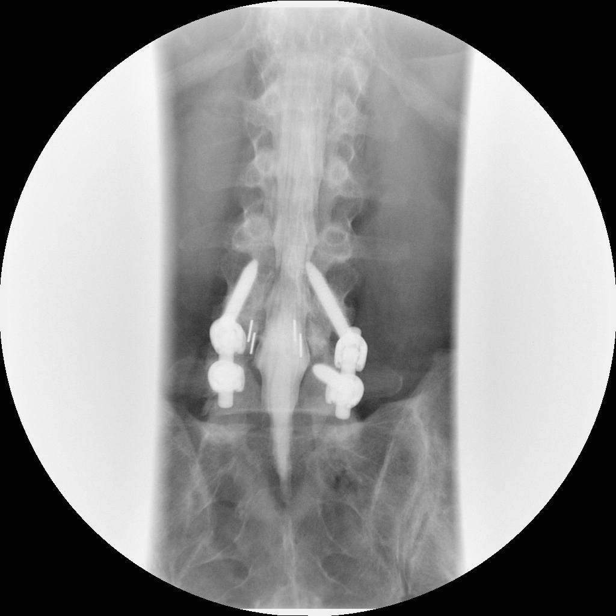

[Series 16: myelogram  white · 1 of 1 slices shown (9 of 9)]
[im 1/1]
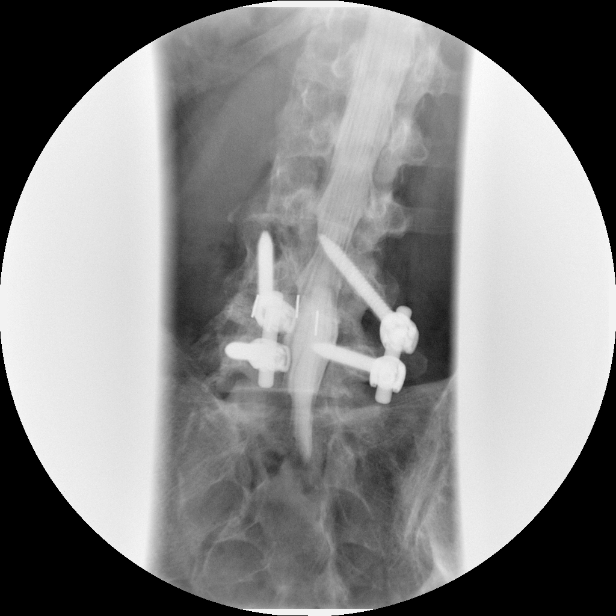

[Series 1001: view not recorded · 0.20mm/px · 1 of 1 slices shown (1 of 3)]
[im 1/1]
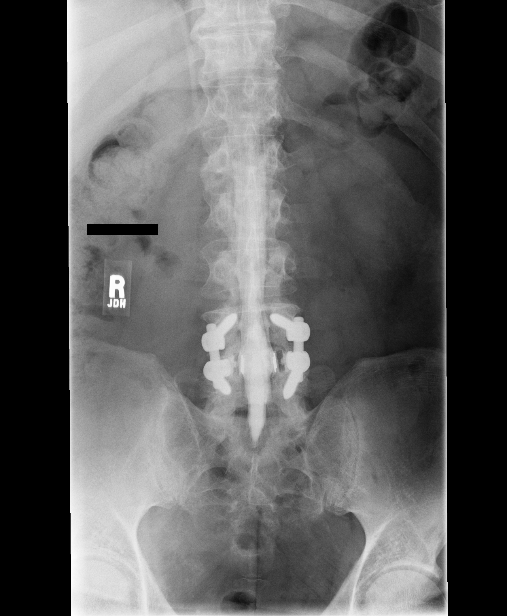

[Series 1002: view not recorded · 0.20mm/px · 1 of 1 slices shown (2 of 3)]
[im 1/1]
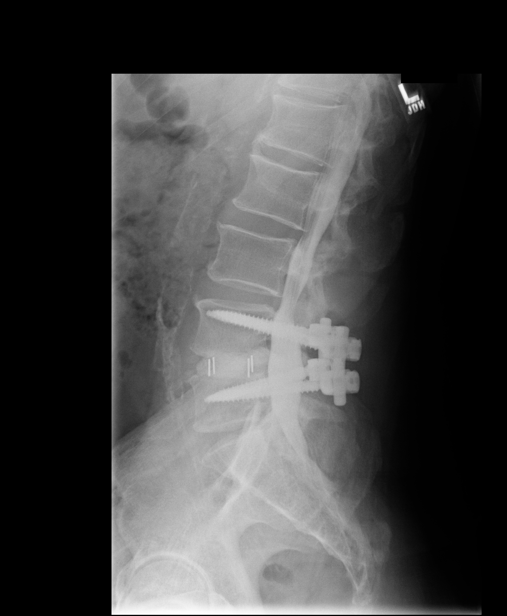

[Series 1004: view not recorded · 0.20mm/px · 1 of 1 slices shown (3 of 3)]
[im 1/1]
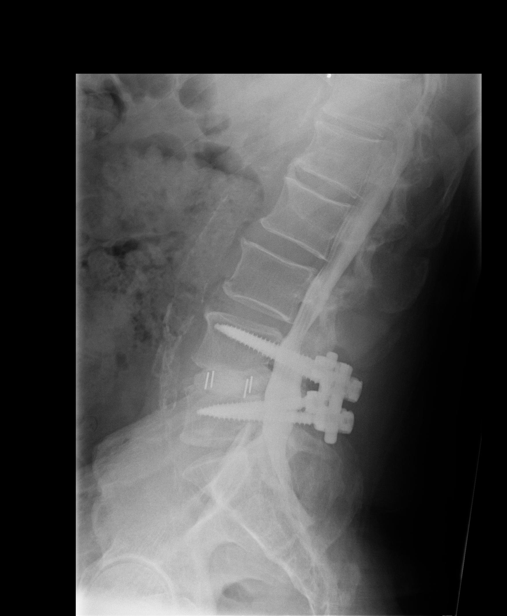

[13 of 19 positions shown; findings below may reference images not displayed]

EXAM:
LUMBAR MYELOGRAM

FLUOROSCOPY TIME:  0 min 44 seconds

PROCEDURE:
After thorough discussion of risks and benefits of the procedure
including bleeding, infection, injury to nerves, blood vessels,
adjacent structures as well as headache and CSF leak, written and
oral informed consent was obtained. Consent was obtained by Dr.
Belarmino Akoto. Time out form was completed.

Patient was positioned prone on the fluoroscopy table. Local
anesthesia was provided with 1% lidocaine without epinephrine after
prepped and draped in the usual sterile fashion. Puncture was
performed at L2-L3 (to avoid fluid collection seen on prior MRI)
using a 3 1/2 inch 22-gauge spinal needle via 22 gauge pencil point
Abdelrhaman spinal needle approach. Using a single pass through the
dura, the needle was placed within the thecal sac, with return of
clear CSF. 15 mL of Tmnipaque-ASW was injected into the thecal sac,
with normal opacification of the nerve roots and cauda equina
consistent with free flow within the subarachnoid space.

I personally performed the lumbar puncture and administered the
intrathecal contrast. I also personally supervised acquisition of
the myelogram images.
FINDINGS: LUMBAR MYELOGRAM FINDINGS:

L4-L5 posterior rod and pedicle screw fixation is present. The
alignment on AP upright images is normal. There is no visible
stenosis on plain films of the lumbar spine. The alignment is
anatomic on the lateral view in the neutral position and there is no
pathologic motion with flexion and extension. The lumbar spine range
of motion is poor, with most of the flexion and extension occurring
at the hips rather than at the lumbar spine. No evidence of
pseudoarthrosis. Lateral recesses appear adequately patent.

CT LUMBAR MYELOGRAM FINDINGS:

Segmentation: The numbering convention used for this exam termed
L5-S1 as the last intervertebral disc space. Numbering used on prior
exam preserved.

Alignment:  Anatomic.

Vertebrae: No destructive osseous lesions. Vertebral body height
preserved.

Conus medullaris: Normal termination at T12-L1.

Paraspinal tissues: Aortoiliac atherosclerosis. Partial ankylosis of
both SI joints.

Disc levels:

T11-T12:  Mild disc degeneration but no stenosis.

T12-L1: Mild facet degeneration. No stenosis. Disc appears normal.

L1-L2: Mild disc degeneration with anterior osteophytes, nearly
bridging and the RIGHT anterior region. No stenosis.

L2-L3: Mild bilateral facet arthrosis. Shallow circumferential disc
bulging. The central canal, lateral recesses and neural foramina
appear patent.

L3-L4: Shallow posterior disc bulging without stenosis. Both neural
foramina appear patent. Mild bilateral facet arthrosis. Mild central
stenosis secondary to bulging disc and posterior ligamentum flavum
redundancy. The subarticular zones appear normal and patent.

L4-L5: Good posterior decompression. The L4 and L5 pedicle screws
are in good position without hardware loosening or failure. The
interbody bone graft shows incorporation with some bridging bone
across the disc space. There is no evidence of the pseudoarthrosis.
Both neural foramina appear patent.

L5-S1: Shallow disc bulge with calcifications centrally. No
resulting stenosis. Both neural foramina appear patent. Central
canal and lateral recesses are patent.
IMPRESSION: LUMBAR MYELOGRAM IMPRESSION:

1. Technically successful lumbar puncture for lumbar myelogram.
2. No complicating features of L4-L5 fusion.
3. Suboptimal range of motion on flexion and extension but no
instability.

CT LUMBAR MYELOGRAM IMPRESSION:

1. Uncomplicated L4-L5 PLIF with good decompression. No recurrent
stenosis. Interbody graft is incorporating as expected.
2. Mild lumbar spine degenerative disease at other levels without
focal stenosis.

## 2015-08-18 ENCOUNTER — Other Ambulatory Visit: Payer: Self-pay | Admitting: Cardiovascular Disease

## 2015-12-03 ENCOUNTER — Other Ambulatory Visit: Payer: Self-pay | Admitting: Cardiovascular Disease

## 2015-12-03 NOTE — Telephone Encounter (Signed)
Rx(s) sent to pharmacy electronically.  

## 2016-01-04 ENCOUNTER — Ambulatory Visit (INDEPENDENT_AMBULATORY_CARE_PROVIDER_SITE_OTHER): Payer: Medicare Other | Admitting: Cardiovascular Disease

## 2016-01-04 ENCOUNTER — Encounter: Payer: Self-pay | Admitting: Cardiovascular Disease

## 2016-01-04 VITALS — BP 122/74 | HR 63 | Ht 68.0 in | Wt 214.0 lb

## 2016-01-04 DIAGNOSIS — E785 Hyperlipidemia, unspecified: Secondary | ICD-10-CM

## 2016-01-04 DIAGNOSIS — I10 Essential (primary) hypertension: Secondary | ICD-10-CM | POA: Diagnosis not present

## 2016-01-04 DIAGNOSIS — I2510 Atherosclerotic heart disease of native coronary artery without angina pectoris: Secondary | ICD-10-CM | POA: Diagnosis not present

## 2016-01-04 DIAGNOSIS — E669 Obesity, unspecified: Secondary | ICD-10-CM

## 2016-01-04 DIAGNOSIS — I2583 Coronary atherosclerosis due to lipid rich plaque: Principal | ICD-10-CM

## 2016-01-04 MED ORDER — ISOSORBIDE MONONITRATE ER 60 MG PO TB24: 60.0000 mg | ORAL_TABLET | Freq: Every day | ORAL | Status: DC

## 2016-01-04 MED ORDER — CLOPIDOGREL BISULFATE 75 MG PO TABS: 75.0000 mg | ORAL_TABLET | Freq: Every day | ORAL | Status: DC

## 2016-01-04 MED ORDER — ATORVASTATIN CALCIUM 80 MG PO TABS: ORAL_TABLET | ORAL | Status: DC

## 2016-01-04 MED ORDER — METOPROLOL SUCCINATE ER 50 MG PO TB24: ORAL_TABLET | ORAL | Status: DC

## 2016-01-04 MED ORDER — AMLODIPINE BESYLATE 10 MG PO TABS: 10.0000 mg | ORAL_TABLET | Freq: Every day | ORAL | Status: DC

## 2016-01-04 MED ORDER — RAMIPRIL 5 MG PO CAPS: 5.0000 mg | ORAL_CAPSULE | Freq: Every day | ORAL | Status: DC

## 2016-01-04 NOTE — Progress Notes (Signed)
Patient ID: Sean Fritz, male   DOB: November 07, 1955, 60 y.o.   MRN: [id number]     HPI: Sean Fritz is a 60 y.o. male with known coronary artery disease who presents to the office today for an 9 month followup cardiology evaluation.   Sean Fritz has known CAD and suffered a non-Q-wave MI and underwent initial PTCA of his mid distal RCA as well as proximal circumflex coronary artery in January 1998. In July 1998 he developed restenosis of his proximal RCA and underwent stenting of his RCA. In March 2011 he underwent stenting of a 90% diagonal stenosis. Additional problems include mixed hyperlipidemia with low HDL levels and high triglycerides consistent with an atherogenic dyslipidemia pattern, hypertension, degenerative joint disease, as well as mild obesity.   On11/08/2013 he underwent L4-L5 redo laminectomy and has noticed significant resolution in some of his previous low extremity symptomatology. His back pain initially improved which resulted in improved ambulation.  He does have issues with urinary incontinence and there is remote history of prostate CA. He tolerated his back surgery from a cardiac standpoint without compromise.   Since I last saw him, he has remained fairly stable without recurrent anginal symptoms.  He continues to have difficulty with his low back.  In the past.  He also has had some hip and urologic issues.  He had recent blood work done by his primary physician, Dr. Coletta Memos.  Electrolytes were normal.  Creatinine 1.25.  He and normal LFTs.  Cholesterol was 146, triglycerides remained elevated at 248, HDL 40 and LDL 56.  VLDL was increased at 49.6.  He was not anemic with hematocrit of 44.5 and hemoglobin of 15.2.  Urinalysis was negative.  He denies chest pain.  He denies palpitations.  He denies presyncope or syncope.  He presents for evaluation.    Past Medical History  Diagnosis Date  . Hypertension   . Depression   . Mixed hyperlipidemia   . Degenerative joint disease     . Cancer [facility])     Prostate   . Myocardial infarction ([place]) 1998     stress Lexiscan Normal; Low risk scan. EF is 58%. Echo 12/07/09/ normal EF > 55%   . Inability to control urination     d/t prostate surgery; pt wears pad  . Constipation due to pain medication   . Rosacea     on nose  . Coronary artery disease     Past Surgical History  Procedure Laterality Date  . Coronary angioplasty with stent placement  1998 & 12/01/09    2.25 x80m Taxus Atom Liberte DES stent-diagnal vessel with cutting balloon arthrotomy.  . Back surgery  Nov 2009  . Prostate surgery    . Elbow surgery Left July 2010  . Tonsillectomy      Allergies  Allergen Reactions  . Duloxetine Hcl Other (See Comments)    "pass out"  . No Known Drug Allergy     Current Outpatient Prescriptions  Medication Sig Dispense Refill  . amLODipine (NORVASC) 10 MG tablet Take 1 tablet (10 mg total) by mouth daily. 90 tablet 3  . aspirin 81 MG tablet Take 81 mg by mouth daily.     .Marland Kitchenatorvastatin (LIPITOR) 80 MG tablet Take 1 tablet by mouth  daily 90 tablet 3  . buPROPion (WELLBUTRIN XL) 150 MG 24 hr tablet Take 150 mg by mouth 3 (three) times daily.    . clopidogrel (PLAVIX) 75 MG tablet Take 1 tablet (75 mg total)  by mouth daily. 90 tablet 3  . diphenhydramine-acetaminophen (TYLENOL PM) 25-500 MG TABS Take 2 tablets by mouth at bedtime.    [name] Sodium (DSS) 100 MG CAPS Take 100 mg by mouth daily as needed.    Marland Kitchen EPIPEN 2-PAK 0.3 MG/0.3ML SOAJ injection     . FLUoxetine (PROZAC) 20 MG capsule Take 20 mg by mouth daily.    Marland Kitchen HYDROcodone-acetaminophen (NORCO) 10-325 MG per tablet Take 1 tablet by mouth 4 (four) times daily.    . isosorbide mononitrate (IMDUR) 60 MG 24 hr tablet Take 1 tablet (60 mg total) by mouth daily. 90 tablet 3  . metoprolol succinate (TOPROL-XL) 50 MG 24 hr tablet Take 1 and 1/2 tablets by  mouth daily 135 tablet 3  . nitroGLYCERIN (NITROSTAT) 0.4 MG SL tablet Place 0.4 mg under the tongue as  needed.    Marland Kitchen PROCTOSOL HC 2.5 % rectal cream Apply 1 application topically as needed.    . ramipril (ALTACE) 5 MG capsule Take 1 capsule (5 mg total) by mouth daily. 90 capsule 3  . tiZANidine (ZANAFLEX) 4 MG tablet Take 1 tablet by mouth as needed.  0  . topiramate (TOPAMAX) 50 MG tablet Take 50 mg by mouth 2 (two) times daily.     No current facility-administered medications for this visit.    Socially he is divorced. There is a remote tobacco history. He does try to exercise but he has been limited by his mild residual back discomfort .  ROS General: Negative; No fevers, chills, or night sweats;  HEENT: Negative; No changes in vision or hearing, sinus congestion, difficulty swallowing Pulmonary: Negative; No cough, wheezing, shortness of breath, hemoptysis Cardiovascular: Negative; No chest pain, presyncope, syncope, palpatations GI: Negative; No nausea, vomiting, diarrhea, or abdominal pain GU: History of robotic prostate removal secondary to prostate CA with residual urinary incontinence; difficulty with erectile function; No dysuria, hematuria, or difficulty voiding Musculoskeletal: Positive for lumbar disc disease, status post L4-L5 spinal fusion; positive for hip discomfort  Hematologic/Oncology: Negative; no easy bruising, bleeding Endocrine: Probable metabolic syndrome; no heat/cold intolerance; no diabetes Neuro: Negative; no changes in balance, headaches Skin: Negative; No rashes or skin lesions Psychiatric: Negative; No behavioral problems, depression Sleep: Negative; No snoring, daytime sleepiness, hypersomnolence, bruxism, restless legs, hypnogognic hallucinations, no cataplexy Other comprehensive 14 point system review is negative.   PE BP 122/74 mmHg  Pulse 63  Ht 5' 8" (1.727 m)  Wt 214 lb (97.07 kg)  BMI 32.55 kg/m2   Wt Readings from Last 3 Encounters:  01/04/16 214 lb (97.07 kg)  04/06/15 214 lb 11.2 oz (97.387 kg)  09/08/14 222 lb (100.699 kg)   General:  Alert, oriented, no distress.  Skin: normal turgor, no rashes HEENT: Normocephalic, atraumatic. Pupils round and reactive; sclera anicteric;no lid lag.  Nose without nasal septal hypertrophy Mouth/Parynx benign; Mallinpatti scale 2 Neck: No JVD, no carotid bruits; normal upstroke Lungs: clear to ausculatation and percussion; no wheezing or rales Chest wall: No tenderness to palpation Heart: RRR, s1 s2 normal no S3 or S4 gallops; 1/6 systolic murmur.  No diastolic murmur, rubs, thrills or heaves Abdomen: soft, nontender; no hepatosplenomehaly, BS+; abdominal aorta nontender and not dilated by palpation. Back: No CVA tenderness Pulses 2+ Extremities: no clubbing cyanosis or edema, Homan's sign negative  Neurologic: grossly nonfocal Psychological: Normal affect and mood  ECG (independently read by me): Normal sinus rhythm at 63 bpm.  No ectopy.  Normal intervals.  Small Q wave in lead 3.  July 2016 ECG (independently read by me):  Normal sinus rhythm at 70 bpm..  No ectopy.  No significant ST changes.  December 2015ECG (independently read by me): normal sinus rhythm at 62 bpm.  PR interval 180 ms.  QTc interval 420 ms.  June 2015 ECG (independently read by me): Normal sinus rhythm at 69 beats per minute.  No significant ST-T change  ECG: Sinus rhythm at 70 beats per minute. No ectopy. Intervals normal.  LABS: I have personally reviewed the laboratory results done by Dr. Coletta Memos from 12/08/2015 as outlined above.   BMP Latest Ref Rng 04/27/2015 03/27/2015 02/24/2014  Glucose 65 - 99 mg/dL 111(H) 103(H) 112(H)  BUN 7 - 25 mg/dL 23 25(H) 22  Creatinine 0.70 - 1.33 mg/dL 1.19 1.41(H) 1.15  Sodium 135 - 146 mmol/L 141 141 140  Potassium 3.5 - 5.3 mmol/L 4.1 4.2 4.4  Chloride 98 - 110 mmol/L 108 107 104  CO2 20 - 31 mmol/L _0 Calcium 8.6 - 10.3 mg/dL 9.3 8.7 9.5   Hepatic Function Latest Ref Rng 03/27/2015 02/24/2014  Total Protein 6.0 - 8.3 g/dL 6.2 6.5  Albumin 3.5 - 5.2 g/dL 4.3 4.6    AST 0 - 37 U/L 14 19  ALT 0 - 53 U/L 22 27  Alk Phosphatase 39 - 117 U/L 90 104  Total Bilirubin 0.2 - 1.2 mg/dL 0.5 0.5   CBC Latest Ref Rng 03/27/2015 02/24/2014 08/10/2013  WBC 4.0 - 10.5 K/uL 7.3 7.7 12.6(H)  Hemoglobin 13.0 - 17.0 g/dL 13.8 13.3 10.5(L)  Hematocrit 39.0 - 52.0 % 41.7 39.3 29.5(L)  Platelets 150 - 400 K/uL 206 205 179   Lab Results  Component Value Date   MCV 88.9 03/27/2015   MCV 86.9 02/24/2014   MCV 87.8 08/10/2013   Lab Results  Component Value Date   TSH 1.710 03/27/2015   Lipid Panel     Component Value Date/Time   CHOL 116 03/27/2015 1118   TRIG 185* 03/27/2015 1118   HDL 30* 03/27/2015 1118   CHOLHDL 3.9 03/27/2015 1118   VLDL 37 03/27/2015 1118   LDLCALC 49 03/27/2015 1118      ASSESSMENT AND PLAN: Sean Fritz  is a 79 -year-old white male who has CAD and is s/p remote intervention to his right coronary artery, circumflex vessel, as well as diagonal vessels.  He is without recurrent anginal symptoms on his current medical regimen including isosorbide mononitrate 60 mg, amlodipine 10 mg, Toprol-XL 75 mg daily , and ramipril 5 mg for ACE-I.  His blood pressure is well-controlled on this regimen. He is has continued to be on dual antiplatelet therapy with aspirin and Plavix with his multiple stents.  His blood pressure has remained stable despite reducing his ramipril dose last year.  I reviewed his most recent blood work in detail with him.  He continues to have significant triglyceride elevation.  He lives alone.  Oftentimes he has TV dinners and often cooks a fair amount of [place] food.  We discussed the importance of reduction of carbohydrate intake as well as sugars.  He denies bleeding on chronic DAPT.  He is not having any anginal symptoms.  He continues have chronic back pain and was wondering about implantable stimulator.  He continues to take Prozac and wellbutrin for anxiety and depression.  He is followed urologically for his history of prostate  CA.  As long as he remains cardiac stable, I will see him in one year for reevaluation.  Time  spent: 25 minutes  Sean Sine, MD, [facility]  01/04/2016 2:03 PM

## 2016-01-04 NOTE — Patient Instructions (Signed)
Your physician wants you to follow-up in: 1 year or sooner if needed. You will receive a reminder letter in the mail two months in advance. If you don't receive a letter, please call our office to schedule the follow-up appointment.  

## 2016-10-25 ENCOUNTER — Other Ambulatory Visit: Payer: Self-pay | Admitting: Cardiovascular Disease

## 2016-10-25 NOTE — Telephone Encounter (Signed)
Rx(s) sent to pharmacy electronically.  

## 2016-12-30 ENCOUNTER — Other Ambulatory Visit: Payer: Self-pay | Admitting: Cardiovascular Disease

## 2016-12-30 NOTE — Telephone Encounter (Signed)
REFILL 

## 2017-02-01 ENCOUNTER — Encounter: Payer: Self-pay | Admitting: Cardiovascular Disease

## 2017-02-01 ENCOUNTER — Ambulatory Visit (INDEPENDENT_AMBULATORY_CARE_PROVIDER_SITE_OTHER): Payer: Medicare Other | Admitting: Cardiovascular Disease

## 2017-02-01 VITALS — BP 127/75 | HR 68 | Ht 68.0 in | Wt 214.4 lb

## 2017-02-01 DIAGNOSIS — I2510 Atherosclerotic heart disease of native coronary artery without angina pectoris: Secondary | ICD-10-CM | POA: Diagnosis not present

## 2017-02-01 DIAGNOSIS — I2583 Coronary atherosclerosis due to lipid rich plaque: Secondary | ICD-10-CM

## 2017-02-01 DIAGNOSIS — E669 Obesity, unspecified: Secondary | ICD-10-CM | POA: Diagnosis not present

## 2017-02-01 DIAGNOSIS — E785 Hyperlipidemia, unspecified: Secondary | ICD-10-CM | POA: Diagnosis not present

## 2017-02-01 DIAGNOSIS — I10 Essential (primary) hypertension: Secondary | ICD-10-CM

## 2017-02-01 MED ORDER — CLOPIDOGREL BISULFATE 75 MG PO TABS: 75.0000 mg | ORAL_TABLET | Freq: Every day | ORAL | 2 refills | Status: DC

## 2017-02-01 MED ORDER — ATORVASTATIN CALCIUM 80 MG PO TABS: 80.0000 mg | ORAL_TABLET | Freq: Every day | ORAL | 2 refills | Status: DC

## 2017-02-01 MED ORDER — RAMIPRIL 5 MG PO CAPS: 5.0000 mg | ORAL_CAPSULE | Freq: Every day | ORAL | 2 refills | Status: DC

## 2017-02-01 MED ORDER — ISOSORBIDE MONONITRATE ER 60 MG PO TB24: 60.0000 mg | ORAL_TABLET | Freq: Every day | ORAL | 2 refills | Status: DC

## 2017-02-01 MED ORDER — AMLODIPINE BESYLATE 10 MG PO TABS: 10.0000 mg | ORAL_TABLET | Freq: Every day | ORAL | 2 refills | Status: DC

## 2017-02-01 NOTE — Progress Notes (Signed)
Patient ID: Sean Fritz, male   DOB: [birth year], 61 y.o.   MRN: [id number]     HPI: Sean Fritz is a 61 y.o. male with known coronary artery disease who presents to the office today for an 5 month followup cardiology evaluation.   Sean Fritz has known CAD and suffered a non-Q-wave MI and underwent initial PTCA of his mid distal RCA as well as proximal circumflex coronary artery in January 1998. In July 1998 he developed restenosis of his proximal RCA and underwent stenting of his RCA. In March 2011 he underwent stenting of a 90% diagonal stenosis. Additional problems include mixed hyperlipidemia with low HDL levels and high triglycerides consistent with an atherogenic dyslipidemia pattern, hypertension, degenerative joint disease, as well as mild obesity.   On11/08/2013 he underwent L4-L5 redo laminectomy and has noticed significant resolution in some of his previous low extremity symptomatology. His back pain initially improved which resulted in improved ambulation.  He does have issues with urinary incontinence and there is remote history of prostate CA. He tolerated his back surgery from a cardiac standpoint without compromise.   Since I last saw him, he has remained fairly stable without recurrent anginal symptoms.  He continues to have difficulty with his low back pain which limits his activity.  He does walk.  He had lost weight down to 198 pounds, but has regained some of this weight back.  He sees Dr. Sherrell Puller, for primary care, was part of the [facility].  Laboratory from 12/13/2016 was reviewed.  Total cholesterol was 137, triglycerides 179, HDL 41, LDL 60.  CBC was normal.  Potassium 4.7.  BUN 24, creatinine 1.36.  PSA was less than 0.01.  He presents for one-year evaluation.   Past Medical History:  Diagnosis Date  . Cancer [facility])    Prostate   . Constipation due to pain medication   . Coronary artery disease   . Degenerative joint disease   . Depression   .  Hypertension   . Inability to control urination    d/t prostate surgery; pt wears pad  . Mixed hyperlipidemia   . Myocardial infarction ([place]) 1998    stress Lexiscan Normal; Low risk scan. EF is 58%. Echo 12/07/09/ normal EF > 55%   . Rosacea    on nose    Past Surgical History:  Procedure Laterality Date  . BACK SURGERY  Nov 2009  . CORONARY ANGIOPLASTY WITH STENT PLACEMENT  1998 & 12/01/09   2.25 x91m Taxus Atom Liberte DES stent-diagnal vessel with cutting balloon arthrotomy.  . ELBOW SURGERY Left July 2010  . PROSTATE SURGERY    . TONSILLECTOMY      Allergies  Allergen Reactions  . Duloxetine Hcl Other (See Comments)    "pass out"  . No Known Drug Allergy     Current Outpatient Prescriptions  Medication Sig Dispense Refill  . amLODipine (NORVASC) 10 MG tablet Take 1 tablet (10 mg total) by mouth daily. NEED OV. 90 tablet 0  . aspirin 81 MG tablet Take 81 mg by mouth daily.     .Marland Kitchenatorvastatin (LIPITOR) 80 MG tablet Take 1 tablet (80 mg total) by mouth daily. NEED OV. 90 tablet 0  . buPROPion (WELLBUTRIN XL) 150 MG 24 hr tablet Take 150 mg by mouth 3 (three) times daily.    . clopidogrel (PLAVIX) 75 MG tablet Take 1 tablet (75 mg total) by mouth daily. NEED OV. 90 tablet 0  . diphenhydramine-acetaminophen (TYLENOL  PM) 25-500 MG TABS Take 2 tablets by mouth at bedtime.    [name] Sodium (DSS) 100 MG CAPS Take 100 mg by mouth daily as needed.    Marland Kitchen EPIPEN 2-PAK 0.3 MG/0.3ML SOAJ injection     . HYDROcodone-acetaminophen (NORCO) 10-325 MG per tablet Take 1 tablet by mouth 4 (four) times daily.    . isosorbide mononitrate (IMDUR) 60 MG 24 hr tablet Take 1 tablet (60 mg total) by mouth daily. NEED OV. 90 tablet 0  . metoprolol succinate (TOPROL-XL) 50 MG 24 hr tablet Take 2 tablets (100 mg total) by mouth daily. NEED OV. 135 tablet 0  . nitroGLYCERIN (NITROSTAT) 0.4 MG SL tablet Place 0.4 mg under the tongue as needed.    Marland Kitchen PROCTOSOL HC 2.5 % rectal cream Apply 1 application  topically as needed.    . ramipril (ALTACE) 5 MG capsule Take 1 capsule (5 mg total) by mouth daily. NEED OV. 90 capsule 0  . tiZANidine (ZANAFLEX) 4 MG tablet Take 1 tablet by mouth as needed.  0  . topiramate (TOPAMAX) 50 MG tablet Take 50 mg by mouth 2 (two) times daily.    Marland Kitchen FLUoxetine (PROZAC) 20 MG capsule Take 20 mg by mouth daily.     No current facility-administered medications for this visit.     Socially he is divorced. There is a remote tobacco history. He does try to exercise but he has been limited by his mild residual back discomfort .  ROS General: Negative; No fevers, chills, or night sweats;  HEENT: Negative; No changes in vision or hearing, sinus congestion, difficulty swallowing Pulmonary: Negative; No cough, wheezing, shortness of breath, hemoptysis Cardiovascular: Negative; No chest pain, presyncope, syncope, palpatations GI: Negative; No nausea, vomiting, diarrhea, or abdominal pain GU: History of robotic prostate removal secondary to prostate CA with residual urinary incontinence; difficulty with erectile function; No dysuria, hematuria, or difficulty voiding Musculoskeletal: Positive for lumbar disc disease, status post L4-L5 spinal fusion; positive for hip discomfort  Hematologic/Oncology: Negative; no easy bruising, bleeding Endocrine: Probable metabolic syndrome; no heat/cold intolerance; no diabetes Neuro: Negative; no changes in balance, headaches Skin: Negative; No rashes or skin lesions Psychiatric: Negative; No behavioral problems, depression Sleep: Negative; No snoring, daytime sleepiness, hypersomnolence, bruxism, restless legs, hypnogognic hallucinations, no cataplexy Other comprehensive 14 point system review is negative.   PE BP 127/75   Pulse 68   Ht '5\' 8"'  (1.727 m)   Wt 214 lb 6.4 oz (97.3 kg)   BMI 32.60 kg/m    Repeat blood pressure by me 124/76  Wt Readings from Last 3 Encounters:  02/01/17 214 lb 6.4 oz (97.3 kg)  01/04/16 214 lb  (97.1 kg)  04/06/15 214 lb 11.2 oz (97.4 kg)   General: Alert, oriented, no distress.  Skin: normal turgor, no rashes HEENT: Normocephalic, atraumatic. Pupils round and reactive; sclera anicteric;no lid lag.  Nose without nasal septal hypertrophy Mouth/Parynx benign; Mallinpatti scale 2 Neck: No JVD, no carotid bruits; normal upstroke Lungs: clear to ausculatation and percussion; no wheezing or rales Chest wall: No tenderness to palpation Heart: RRR, s1 s2 normal no S3 or S4 gallops; 1/6 systolic murmur.  No diastolic murmur, rubs, thrills or heaves Abdomen: soft, nontender; no hepatosplenomehaly, BS+; abdominal aorta nontender and not dilated by palpation. Back: No CVA tenderness Pulses 2+ Extremities: no clubbing cyanosis or edema, Homan's sign negative  Neurologic: grossly nonfocal Psychological: Normal affect and mood  ECG (independently read by me): Normal sinus rhythm at 68 bpm.  Normal intervals.  No significant ST changes.  April 2017 ECG (independently read by me): Normal sinus rhythm at 63 bpm.  No ectopy.  Normal intervals.  Small Q wave in lead 3.  July 2016 ECG (independently read by me):  Normal sinus rhythm at 70 bpm..  No ectopy.  No significant ST changes.  December 2015ECG (independently read by me): normal sinus rhythm at 62 bpm.  PR interval 180 ms.  QTc interval 420 ms.  June 2015 ECG (independently read by me): Normal sinus rhythm at 69 beats per minute.  No significant ST-T change  ECG: Sinus rhythm at 70 beats per minute. No ectopy. Intervals normal.  LABS: I have personally reviewed the laboratory results done by Dr. Coletta Memos from 12/13/2016 as outlined above.   BMP Latest Ref Rng & Units 04/27/2015 03/27/2015 02/24/2014  Glucose 65 - 99 mg/dL 111(H) 103(H) 112(H)  BUN 7 - 25 mg/dL 23 25(H) 22  Creatinine 0.70 - 1.33 mg/dL 1.19 1.41(H) 1.15  Sodium 135 - 146 mmol/L 141 141 140  Potassium 3.5 - 5.3 mmol/L 4.1 4.2 4.4  Chloride 98 - 110 mmol/L 108 107 104  CO2  20 - 31 mmol/L '22 23 29  ' Calcium 8.6 - 10.3 mg/dL 9.3 8.7 9.5   Hepatic Function Latest Ref Rng & Units 03/27/2015 02/24/2014  Total Protein 6.0 - 8.3 g/dL 6.2 6.5  Albumin 3.5 - 5.2 g/dL 4.3 4.6  AST 0 - 37 U/L 14 19  ALT 0 - 53 U/L 22 27  Alk Phosphatase 39 - 117 U/L 90 104  Total Bilirubin 0.2 - 1.2 mg/dL 0.5 0.5   CBC Latest Ref Rng & Units 03/27/2015 02/24/2014 08/10/2013  WBC 4.0 - 10.5 K/uL 7.3 7.7 12.6(H)  Hemoglobin 13.0 - 17.0 g/dL 13.8 13.3 10.5(L)  Hematocrit 39.0 - 52.0 % 41.7 39.3 29.5(L)  Platelets 150 - 400 K/uL 206 205 179   Lab Results  Component Value Date   MCV 88.9 03/27/2015   MCV 86.9 02/24/2014   MCV 87.8 08/10/2013   Lab Results  Component Value Date   TSH 1.710 03/27/2015   Lipid Panel     Component Value Date/Time   CHOL 116 03/27/2015 1118   TRIG 185 (H) 03/27/2015 1118   HDL 30 (L) 03/27/2015 1118   CHOLHDL 3.9 03/27/2015 1118   VLDL 37 03/27/2015 1118   LDLCALC 49 03/27/2015 1118    IMPRESSION:  No diagnosis found.   ASSESSMENT AND PLAN: Sean Fritz  is a 61 year old white male who has CAD and is s/p remote intervention to his right coronary artery, circumflex vessel, as well as diagonal vessels.  He continues to be stable without recurrent anginal symptoms on his current medical regimen including isosorbide mononitrate 60 mg, amlodipine 10 mg, Toprol-XL 75 mg daily , and ramipril 5 mg for ACE-I.  His blood pressure is well-controlled on this regimen.  He has multiple stents and continues to be on dual antiplatelet therapy with aspirin and Plavix.  His blood pressure has remained stable despite reducing his ramipril dose to 5 mg.  I reviewed his most recent blood work in detail with him.  When I saw him last year, his triglycerides remained elevated.  They continue to be mildly elevated at his last lab work at 179.  We again discussed improved diet and reduction in carbohydrates.  His LDL is excellent at 60.  He had lost weight but has regained 16 pounds  back from his nadir of 198.  BMI is still  consistent with mild obesity at 32.6.  He will continue his current regimen.  As long as remains stable I will see him in one year for reevaluation. Time spent: 25 minutes  Troy Sine, MD, [facility]  02/01/2017 2:41 PM

## 2017-02-01 NOTE — Patient Instructions (Signed)
Your physician wants you to follow-up in: 1 year or sooner if needed. You will receive a reminder letter in the mail two months in advance. If you don't receive a letter, please call our office to schedule the follow-up appointment.   If you need a refill on your cardiac medications before your next appointment, please call your pharmacy.   

## 2017-04-17 ENCOUNTER — Other Ambulatory Visit: Payer: Self-pay | Admitting: Cardiovascular Disease

## 2017-09-06 ENCOUNTER — Other Ambulatory Visit: Payer: Self-pay | Admitting: Cardiovascular Disease

## 2017-09-06 NOTE — Telephone Encounter (Signed)
Rx(s) sent to pharmacy electronically.  

## 2018-01-26 ENCOUNTER — Encounter: Payer: Self-pay | Admitting: Cardiovascular Disease

## 2018-01-26 ENCOUNTER — Ambulatory Visit: Payer: Medicare Other | Admitting: Cardiovascular Disease

## 2018-01-26 VITALS — BP 118/60 | HR 62 | Ht 68.0 in | Wt 218.6 lb

## 2018-01-26 DIAGNOSIS — I2510 Atherosclerotic heart disease of native coronary artery without angina pectoris: Secondary | ICD-10-CM | POA: Diagnosis not present

## 2018-01-26 DIAGNOSIS — E669 Obesity, unspecified: Secondary | ICD-10-CM

## 2018-01-26 DIAGNOSIS — M549 Dorsalgia, unspecified: Secondary | ICD-10-CM

## 2018-01-26 DIAGNOSIS — I10 Essential (primary) hypertension: Secondary | ICD-10-CM | POA: Diagnosis not present

## 2018-01-26 DIAGNOSIS — I2583 Coronary atherosclerosis due to lipid rich plaque: Secondary | ICD-10-CM

## 2018-01-26 DIAGNOSIS — E785 Hyperlipidemia, unspecified: Secondary | ICD-10-CM

## 2018-01-26 DIAGNOSIS — Z79899 Other long term (current) drug therapy: Secondary | ICD-10-CM | POA: Diagnosis not present

## 2018-01-26 MED ORDER — RAMIPRIL 5 MG PO CAPS
5.0000 mg | ORAL_CAPSULE | Freq: Every day | ORAL | 3 refills | Status: DC
Start: 2018-01-26 — End: 2019-02-04

## 2018-01-26 MED ORDER — ISOSORBIDE MONONITRATE ER 60 MG PO TB24: 60.0000 mg | ORAL_TABLET | Freq: Every day | ORAL | 3 refills | Status: DC

## 2018-01-26 MED ORDER — EZETIMIBE 10 MG PO TABS: 10.0000 mg | ORAL_TABLET | Freq: Every day | ORAL | 3 refills | Status: DC

## 2018-01-26 MED ORDER — ATORVASTATIN CALCIUM 80 MG PO TABS: 80.0000 mg | ORAL_TABLET | Freq: Every day | ORAL | 3 refills | Status: DC

## 2018-01-26 MED ORDER — AMLODIPINE BESYLATE 10 MG PO TABS: 10.0000 mg | ORAL_TABLET | Freq: Every day | ORAL | 3 refills | Status: DC

## 2018-01-26 MED ORDER — NITROGLYCERIN 0.4 MG SL SUBL: 0.4000 mg | SUBLINGUAL_TABLET | SUBLINGUAL | 2 refills | Status: DC | PRN

## 2018-01-26 MED ORDER — CLOPIDOGREL BISULFATE 75 MG PO TABS: 75.0000 mg | ORAL_TABLET | Freq: Every day | ORAL | 3 refills | Status: DC

## 2018-01-26 MED ORDER — METOPROLOL SUCCINATE ER 50 MG PO TB24: 100.0000 mg | ORAL_TABLET | Freq: Every day | ORAL | 3 refills | Status: DC

## 2018-01-26 NOTE — Progress Notes (Signed)
Patient ID: PARTHIV MUCCI, male   DOB: 03/14/1956, 62 y.o.   MRN: [id number]     HPI: AHMED INNISS is a 62 y.o. male with known coronary artery disease who presents to the office today for an 12 month followup cardiology evaluation.   Mr. Francois has known CAD and suffered a non-Q-wave MI and underwent initial PTCA of his mid distal RCA as well as proximal circumflex coronary artery in January 1998. In July 1998 he developed restenosis of his proximal RCA and underwent stenting of his RCA. In March 2011 he underwent stenting of a 90% diagonal stenosis. Additional problems include mixed hyperlipidemia with low HDL levels and high triglycerides consistent with an atherogenic dyslipidemia pattern, hypertension, degenerative joint disease, as well as mild obesity.   On 08/07/2013 he underwent L4-L5 redo laminectomy and has noticed significant resolution in some of his previous low extremity symptomatology. His back pain initially improved which resulted in improved ambulation.  He does have issues with urinary incontinence and there is remote history of prostate CA. He tolerated his back surgery from a cardiac standpoint without compromise.   When I last saw him in May 2018 he was stable from a cardiac standpoint without recurrent anginal symptoms  He had lost weight down to 198 pounds, but has regained some of this weight back.  He sees Dr. Sherrell Puller, for primary care, was part of the [facility] health system [facility].  Laboratory from 12/13/2016: Total cholesterol was 137, triglycerides 179, HDL 41, LDL 60.  CBC was normal.  Potassium 4.7.  BUN 24, creatinine 1.36.  PSA was less than 0.01.   Past year, he continues to do well.  He denies chest pain or significant shortness of breath.  He is single and cooks for himself.  He admits that he often eats hamburgers at least 3-4 times per week.  He is to have some difficulty with low back discomfort.  Recent lab work at [facility] on December 22, 2017.  Cholesterol was  154, triglycerides 171, HDL 39, and LDL was 81.  Creatinine was 1.35.  He denies any palpitations.  He denies presyncope or syncope.  He presents for reevaluation.  Past Medical History:  Diagnosis Date  . Cancer [facility])    Prostate   . Constipation due to pain medication   . Coronary artery disease   . Degenerative joint disease   . Depression   . Hypertension   . Inability to control urination    d/t prostate surgery; pt wears pad  . Mixed hyperlipidemia   . Myocardial infarction ([place]) 1998    stress Lexiscan Normal; Low risk scan. EF is 58%. Echo 12/07/09/ normal EF > 55%   . Rosacea    on nose    Past Surgical History:  Procedure Laterality Date  . BACK SURGERY  Nov 2009  . CORONARY ANGIOPLASTY WITH STENT PLACEMENT  1998 & 12/01/09   2.25 x19m Taxus Atom Liberte DES stent-diagnal vessel with cutting balloon arthrotomy.  . ELBOW SURGERY Left July 2010  . PROSTATE SURGERY    . TONSILLECTOMY      Allergies  Allergen Reactions  . Duloxetine Hcl Other (See Comments)    "pass out" Other reaction(s): Other "pass out"  . Duloxetine     Other reaction(s): Other (See Comments) "pass out" "pass out"   . No Known Drug Allergy     Current Outpatient Medications  Medication Sig Dispense Refill  . amLODipine (NORVASC) 10 MG tablet Take 1 tablet (  10 mg total) by mouth daily. 90 tablet 3  . aspirin 81 MG tablet Take 81 mg by mouth daily.     Marland Kitchen atorvastatin (LIPITOR) 80 MG tablet Take 1 tablet (80 mg total) by mouth daily. 90 tablet 3  . buPROPion (WELLBUTRIN XL) 150 MG 24 hr tablet Take 150 mg by mouth 3 (three) times daily.    . clopidogrel (PLAVIX) 75 MG tablet Take 1 tablet (75 mg total) by mouth daily. 90 tablet 3  . diphenhydramine-acetaminophen (TYLENOL PM) 25-500 MG TABS Take 2 tablets by mouth at bedtime.    [name] Sodium (DSS) 100 MG CAPS Take 100 mg by mouth daily as needed.    Marland Kitchen EPIPEN 2-PAK 0.3 MG/0.3ML SOAJ injection     . HYDROcodone-acetaminophen (NORCO)  10-325 MG per tablet Take 1 tablet by mouth 4 (four) times daily.    . isosorbide mononitrate (IMDUR) 60 MG 24 hr tablet Take 1 tablet (60 mg total) by mouth daily. 90 tablet 3  . metoprolol succinate (TOPROL-XL) 50 MG 24 hr tablet Take 2 tablets (100 mg total) by mouth daily. Take with or immediately following a meal. 180 tablet 3  . nitroGLYCERIN (NITROSTAT) 0.4 MG SL tablet Place 1 tablet (0.4 mg total) under the tongue as needed. 30 tablet 2  . PROCTOSOL HC 2.5 % rectal cream Apply 1 application topically as needed.    . ramipril (ALTACE) 5 MG capsule Take 1 capsule (5 mg total) by mouth daily. 90 capsule 3  . tiZANidine (ZANAFLEX) 4 MG tablet Take 1 tablet by mouth as needed.  0  . topiramate (TOPAMAX) 50 MG tablet Take 50 mg by mouth 2 (two) times daily.    Marland Kitchen ezetimibe (ZETIA) 10 MG tablet Take 1 tablet (10 mg total) by mouth daily. 90 tablet 3  . FLUoxetine (PROZAC) 20 MG capsule Take 20 mg by mouth daily.     No current facility-administered medications for this visit.     Socially he is divorced. There is a remote tobacco history. He does try to exercise but he has been limited by his mild residual back discomfort .  ROS General: Negative; No fevers, chills, or night sweats;  HEENT: Negative; No changes in vision or hearing, sinus congestion, difficulty swallowing Pulmonary: Negative; No cough, wheezing, shortness of breath, hemoptysis Cardiovascular: Negative; No chest pain, presyncope, syncope, palpatations GI: Negative; No nausea, vomiting, diarrhea, or abdominal pain GU: History of robotic prostate removal secondary to prostate CA with residual urinary incontinence; difficulty with erectile function; No dysuria, hematuria, or difficulty voiding Musculoskeletal: Positive for lumbar disc disease, status post L4-L5 spinal fusion; positive for hip discomfort  Hematologic/Oncology: Negative; no easy bruising, bleeding Endocrine: Probable metabolic syndrome; no heat/cold intolerance;  no diabetes Neuro: Negative; no changes in balance, headaches Skin: Negative; No rashes or skin lesions Psychiatric: Negative; No behavioral problems, depression Sleep: Negative; No snoring, daytime sleepiness, hypersomnolence, bruxism, restless legs, hypnogognic hallucinations, no cataplexy Other comprehensive 14 point system review is negative.   PE BP 118/60   Pulse 62   Ht '5\' 8"'$  (1.727 m)   Wt 218 lb 9.6 oz (99.2 kg)   BMI 33.24 kg/m    Repeat blood pressure by me was 128/78  Wt Readings from Last 3 Encounters:  01/26/18 218 lb 9.6 oz (99.2 kg)  02/01/17 214 lb 6.4 oz (97.3 kg)  01/04/16 214 lb (97.1 kg)   General: Alert, oriented, no distress.  Skin: normal turgor, no rashes, warm and dry HEENT: Normocephalic, atraumatic.  Pupils equal round and reactive to light; sclera anicteric; extraocular muscles intact;  Nose without nasal septal hypertrophy Mouth/Parynx benign; Mallinpatti scale 2/3 Neck: No JVD, no carotid bruits; normal carotid upstroke Lungs: clear to ausculatation and percussion; no wheezing or rales Chest wall: without tenderness to palpitation Heart: PMI not displaced, RRR, s1 s2 normal, 1/6 systolic murmur, no diastolic murmur, no rubs, gallops, thrills, or heaves Abdomen: soft, nontender; no hepatosplenomehaly, BS+; abdominal aorta nontender and not dilated by palpation. Back: no CVA tenderness Pulses 2+ Musculoskeletal: full range of motion, normal strength, no joint deformities Extremities: no clubbing cyanosis or edema, Homan's sign negative  Neurologic: grossly nonfocal; Cranial nerves grossly wnl Psychologic: Normal mood and affect   ECG (independently read by me): Sinus rhythm at 62 bpm.  No ectopy.  No ST segment changes.  May 2018 ECG (independently read by me): Normal sinus rhythm at 68 bpm.  Normal intervals.  No significant ST changes.  April 2017 ECG (independently read by me): Normal sinus rhythm at 63 bpm.  No ectopy.  Normal intervals.   Small Q wave in lead 3.  July 2016 ECG (independently read by me):  Normal sinus rhythm at 70 bpm..  No ectopy.  No significant ST changes.  December 2015ECG (independently read by me): normal sinus rhythm at 62 bpm.  PR interval 180 ms.  QTc interval 420 ms.  June 2015 ECG (independently read by me): Normal sinus rhythm at 69 beats per minute.  No significant ST-T change  ECG: Sinus rhythm at 70 beats per minute. No ectopy. Intervals normal.  LABS: I have personally reviewed the laboratory results done by Dr. Coletta Memos from 12/13/2016 as outlined above.  I reviewed laboratory from [facility] [place] from December 22, 2017.   BMP Latest Ref Rng & Units 04/27/2015 03/27/2015 02/24/2014  Glucose 65 - 99 mg/dL 111(H) 103(H) 112(H)  BUN 7 - 25 mg/dL 23 25(H) 22  Creatinine 0.70 - 1.33 mg/dL 1.19 1.41(H) 1.15  Sodium 135 - 146 mmol/L 141 141 140  Potassium 3.5 - 5.3 mmol/L 4.1 4.2 4.4  Chloride 98 - 110 mmol/L 108 107 104  CO2 20 - 31 mmol/L _0 Calcium 8.6 - 10.3 mg/dL 9.3 8.7 9.5   Hepatic Function Latest Ref Rng & Units 03/27/2015 02/24/2014  Total Protein 6.0 - 8.3 g/dL 6.2 6.5  Albumin 3.5 - 5.2 g/dL 4.3 4.6  AST 0 - 37 U/L 14 19  ALT 0 - 53 U/L 22 27  Alk Phosphatase 39 - 117 U/L 90 104  Total Bilirubin 0.2 - 1.2 mg/dL 0.5 0.5   CBC Latest Ref Rng & Units 03/27/2015 02/24/2014 08/10/2013  WBC 4.0 - 10.5 K/uL 7.3 7.7 12.6(H)  Hemoglobin 13.0 - 17.0 g/dL 13.8 13.3 10.5(L)  Hematocrit 39.0 - 52.0 % 41.7 39.3 29.5(L)  Platelets 150 - 400 K/uL 206 205 179   Lab Results  Component Value Date   MCV 88.9 03/27/2015   MCV 86.9 02/24/2014   MCV 87.8 08/10/2013   Lab Results  Component Value Date   TSH 1.710 03/27/2015   Lipid Panel     Component Value Date/Time   CHOL 116 03/27/2015 1118   TRIG 185 (H) 03/27/2015 1118   HDL 30 (L) 03/27/2015 1118   CHOLHDL 3.9 03/27/2015 1118   VLDL 37 03/27/2015 1118   LDLCALC 49 03/27/2015 1118    IMPRESSION:  1. Coronary  artery disease due to lipid rich plaque   2. Hyperlipidemia with target  LDL less than 70   3. Medication management   4. Mild obesity   5. Essential hypertension   6. Discomfort of back     ASSESSMENT AND PLAN: Mr.Kelliher is a 62 year old white male who has CAD and is s/p remote intervention to his right coronary artery, circumflex vessel, as well as diagonal vessels.  He continues to be stable without recurrent anginal symptoms regimen which consists of amlodipine 10 mg, Sorbide 60 mg, Toprol-XL 50 mg, in addition to ramipril 5 mg.  His blood pressure today is stable.  He continues to be on dual antiplatelet therapy with aspirin and Plavix.  There is no bleeding.  I reviewed his recent laboratory.  Lipid studies are elevated despite atorvastatin 80 mg daily.  I have recommended the addition of Zetia 10 mg for more optimal lipid lowering with LDL 60 or below .  Considerable time talking about his diet.  Previously he used to eat a a lot of TV dinners.  Recently he has been having hamburger at least 3 times per week.  I discussed with him the benefits of the Mediterranean diet.  Gust the importance of carbohydrate reduction.  I is increased to 33.24 consistent with mild obesity.  Weight loss and increased exercise was recommended.  Repeat laboratory will be obtained in 3 months.  I will see him in 6 months for cardiology reassessment.  Time spent: 25 minutes  Troy Sine, MD, [facility]  01/28/2018 11:51 AM

## 2018-01-26 NOTE — Patient Instructions (Signed)
Medication Instructions:  START Zetia 10 mg daily  Labwork: Please return for FASTING labs in 3 months (CMET, Lipid)  Our in office lab hours are Monday-Friday 8:00-4:00, closed for lunch 12:45-1:45 pm.  No appointment needed.  Follow-Up: Your physician wants you to follow-up in: 6 months with Dr. Kelly.  You will receive a reminder letter in the mail two months in advance. If you don't receive a letter, please call our office to schedule the follow-up appointment.   Any Other Special Instructions Will Be Listed Below (If Applicable).     If you need a refill on your cardiac medications before your next appointment, please call your pharmacy.   

## 2018-01-28 ENCOUNTER — Encounter: Payer: Self-pay | Admitting: Cardiovascular Disease

## 2018-05-21 LAB — COMPREHENSIVE METABOLIC PANEL
ALBUMIN: 4.9 g/dL — AB (ref 3.6–4.8)
ALT: 23 IU/L (ref 0–44)
AST: 17 IU/L (ref 0–40)
Albumin/Globulin Ratio: 2.6 — ABNORMAL HIGH (ref 1.2–2.2)
Alkaline Phosphatase: 123 IU/L — ABNORMAL HIGH (ref 39–117)
BUN / CREAT RATIO: 16 (ref 10–24)
BUN: 23 mg/dL (ref 8–27)
Bilirubin Total: 0.4 mg/dL (ref 0.0–1.2)
CALCIUM: 9.5 mg/dL (ref 8.6–10.2)
CO2: 20 mmol/L (ref 20–29)
CREATININE: 1.42 mg/dL — AB (ref 0.76–1.27)
Chloride: 104 mmol/L (ref 96–106)
GFR calc Af Amer: 61 mL/min/{1.73_m2} (ref >59)
GFR, EST NON AFRICAN AMERICAN: 53 mL/min/{1.73_m2} — AB (ref >59)
GLOBULIN, TOTAL: 1.9 g/dL (ref 1.5–4.5)
Glucose: 98 mg/dL (ref 65–99)
Potassium: 4.4 mmol/L (ref 3.5–5.2)
SODIUM: 140 mmol/L (ref 134–144)
TOTAL PROTEIN: 6.8 g/dL (ref 6.0–8.5)

## 2018-05-21 LAB — LIPID PANEL
CHOL/HDL RATIO: 2.6 ratio (ref 0.0–5.0)
Cholesterol, Total: 124 mg/dL (ref 100–199)
HDL: 48 mg/dL (ref >39)
LDL CALC: 46 mg/dL (ref 0–99)
Triglycerides: 151 mg/dL — ABNORMAL HIGH (ref 0–149)
VLDL Cholesterol Cal: 30 mg/dL (ref 5–40)

## 2018-06-18 ENCOUNTER — Telehealth: Payer: Self-pay | Admitting: Cardiovascular Disease

## 2018-06-18 NOTE — Telephone Encounter (Signed)
Okay to hold Zetia for now and call back in 1-2 weeks to report any change in symptoms.

## 2018-06-18 NOTE — Telephone Encounter (Signed)
Pt aware of Pharm-D recommendation to hold Zetia for 2 weeks to see if his symptoms resolve. Pt is to contact our office in 2 weeks to update. Pt verbalized understanding.  Pt sts that he will contact his pcp regarding his LE swelling.

## 2018-06-18 NOTE — Telephone Encounter (Signed)
New message Pt c/o medication issue:  1. Name of Medication: ezetimibe (ZETIA) 10 MG tablet(Expired)  2. How are you currently taking this medication (dosage and times per day)? 1 time daily  3. Are you having a reaction (difficulty breathing--STAT)? No   4. What is your medication issue? Feet swelling and body aches

## 2018-06-18 NOTE — Telephone Encounter (Signed)
Spoke with pt. Pt sts that the last 3 weeks he has been experiencing increased muscle pain and LE swelling.  He stst that he has a hard time putting his shoe on. He denies sob, weight gain, palpitations,  increased sodium and fluid intake. He is treated for chronic pain syndrome and sts the pain the last 3 weeks has worsened. He sts that at times the muscle pain is severe. He is taking all of his medications as prescribed, he feels that his symptoms are related to his newest medication Zetia.  He is currently taking Atorvastatin 80mg  daily and Amlodipine 10mg  daily.  Adv pt that Dr.Kelly is out of the office.  I will send a message to our Pharm-D and call back with their recommendation. Pt agreeable with plan and verbalized understanding.

## 2018-07-11 ENCOUNTER — Encounter: Payer: Self-pay | Admitting: Physician Assistant

## 2018-07-11 ENCOUNTER — Ambulatory Visit: Payer: Medicare Other | Admitting: Physician Assistant

## 2018-07-11 VITALS — BP 128/74 | HR 65 | Ht 68.0 in | Wt 218.6 lb

## 2018-07-11 DIAGNOSIS — I10 Essential (primary) hypertension: Secondary | ICD-10-CM | POA: Diagnosis not present

## 2018-07-11 DIAGNOSIS — E785 Hyperlipidemia, unspecified: Secondary | ICD-10-CM | POA: Diagnosis not present

## 2018-07-11 DIAGNOSIS — I2510 Atherosclerotic heart disease of native coronary artery without angina pectoris: Secondary | ICD-10-CM | POA: Diagnosis not present

## 2018-07-11 NOTE — Progress Notes (Signed)
Cardiology Office Note    Date:  07/11/2018   ID:  Sean, Fritz 1956-07-28, MRN [id number]  PCP:  Bernerd Limbo, MD  Cardiologist:  Dr. Claiborne Billings  Chief Complaint  Patient presents with  . Follow-up    seen for Dr. Claiborne Billings.     History of Present Illness:  Sean Fritz is a 62 y.o. male with PMH of CAD, HTN, prostate CA, and HLD.  Patient suffered a non-Q wave MI and underwent initial PTCA of the mid to distal RCA as well as proximal left circumflex artery in January 1998.  In July 1998, he developed restenosis of the proximal RCA and underwent stenting of RCA as well.  In March 2011 he underwent stenting of 90% diagonal lesion.  His last visit with Dr. Claiborne Billings was emailed 2019, Zetia was added to help control his cholesterol better.  Patient presents today for follow-up.  He has stopped his Zetia on 07/03/2018 due to significant worsening of the back pain.  He attributed the back pain to the side effect of the Zetia.  Surprisingly since he stopped his Zetia, back pain has improved.  Due to peripheral swelling, his amlodipine has also been stopped as well.  He was switched to hydrochlorothiazide 25 mg daily by his primary care provider.  His peripheral swelling has also come down.  Otherwise he denies any recent chest pain.  He did have right flank pain several weeks ago, this has resolved.  Overall he is doing quite well from cardiology perspective.  I will hold off on reinitiating Zetia at this time.  We will obtain a fasting lipid panel and LFT in 3 months, if elevated, will need to consider switching to Crestor 40 mg daily.  He can follow-up with Dr. Claiborne Billings in 6 months.    Past Medical History:  Diagnosis Date  . Cancer [facility])    Prostate   . Constipation due to pain medication   . Coronary artery disease   . Degenerative joint disease   . Depression   . Hypertension   . Inability to control urination    d/t prostate surgery; pt wears pad  . Mixed hyperlipidemia   . Myocardial  infarction ([place]) 1998    stress Lexiscan Normal; Low risk scan. EF is 58%. Echo 12/07/09/ normal EF > 55%   . Rosacea    on nose    Past Surgical History:  Procedure Laterality Date  . BACK SURGERY  Nov 2009  . CORONARY ANGIOPLASTY WITH STENT PLACEMENT  1998 & 12/01/09   2.25 x13mm Taxus Atom Liberte DES stent-diagnal vessel with cutting balloon arthrotomy.  . ELBOW SURGERY Left July 2010  . PROSTATE SURGERY    . TONSILLECTOMY      Current Medications: Outpatient Medications Prior to Visit  Medication Sig Dispense Refill  . aspirin 81 MG tablet Take 81 mg by mouth daily.     Marland Kitchen atorvastatin (LIPITOR) 80 MG tablet Take 1 tablet (80 mg total) by mouth daily. 90 tablet 3  . buPROPion (WELLBUTRIN XL) 150 MG 24 hr tablet Take 150 mg by mouth 3 (three) times daily.    . clopidogrel (PLAVIX) 75 MG tablet Take 1 tablet (75 mg total) by mouth daily. 90 tablet 3  . diphenhydramine-acetaminophen (TYLENOL PM) 25-500 MG TABS Take 2 tablets by mouth at bedtime.    [name] Sodium (DSS) 100 MG CAPS Take 100 mg by mouth daily as needed.    Marland Kitchen EPIPEN 2-PAK 0.3 MG/0.3ML SOAJ injection     .  FLUoxetine (PROZAC) 20 MG capsule Take 20 mg by mouth daily.    . hydrochlorothiazide (HYDRODIURIL) 25 MG tablet Take 25 mg by mouth daily.    Marland Kitchen HYDROcodone-acetaminophen (NORCO) 10-325 MG per tablet Take 0.5-1 tablets by mouth 4 (four) times daily.     . isosorbide mononitrate (IMDUR) 60 MG 24 hr tablet Take 1 tablet (60 mg total) by mouth daily. 90 tablet 3  . metoprolol succinate (TOPROL-XL) 50 MG 24 hr tablet Take 2 tablets (100 mg total) by mouth daily. Take with or immediately following a meal. 180 tablet 3  . nitroGLYCERIN (NITROSTAT) 0.4 MG SL tablet Place 1 tablet (0.4 mg total) under the tongue as needed. 30 tablet 2  . PROCTOSOL HC 2.5 % rectal cream Apply 1 application topically as needed.    . ramipril (ALTACE) 5 MG capsule Take 1 capsule (5 mg total) by mouth daily. 90 capsule 3  . tiZANidine  (ZANAFLEX) 4 MG tablet Take 1 tablet by mouth as needed.  0  . topiramate (TOPAMAX) 50 MG tablet Take 50 mg by mouth 2 (two) times daily.    Marland Kitchen amLODipine (NORVASC) 10 MG tablet Take 1 tablet (10 mg total) by mouth daily. 90 tablet 3  . ezetimibe (ZETIA) 10 MG tablet Take 1 tablet (10 mg total) by mouth daily. 90 tablet 3   No facility-administered medications prior to visit.      Allergies:   Duloxetine hcl; Duloxetine; and No known drug allergy   Social History   Socioeconomic History  . Marital status: Single    Spouse name: Not on file  . Number of children: Not on file  . Years of education: Not on file  . Highest education level: Not on file  Occupational History  . Not on file  Social Needs  . Financial resource strain: Not on file  . Food insecurity:    Worry: Not on file    Inability: Not on file  . Transportation needs:    Medical: Not on file    Non-medical: Not on file  Tobacco Use  . Smoking status: Former Smoker    Last attempt to quit: 09/26/1996    Years since quitting: 21.8  . Smokeless tobacco: Never Used  Substance and Sexual Activity  . Alcohol use: No  . Drug use: No  . Sexual activity: Not on file  Lifestyle  . Physical activity:    Days per week: Not on file    Minutes per session: Not on file  . Stress: Not on file  Relationships  . Social connections:    Talks on phone: Not on file    Gets together: Not on file    Attends religious service: Not on file    Active member of club or organization: Not on file    Attends meetings of clubs or organizations: Not on file    Relationship status: Not on file  Other Topics Concern  . Not on file  Social History Narrative  . Not on file     Family History:  The patient's family history includes Cancer in his mother; Lupus in his sister.   ROS:   Please see the history of present illness.    ROS All other systems reviewed and are negative.   PHYSICAL EXAM:   VS:  BP 128/74   Pulse 65   Ht 5'  8" (1.727 m)   Wt 218 lb 9.6 oz (99.2 kg)   SpO2 98%   BMI 33.24 kg/m  GEN: Well nourished, well developed, in no acute distress  HEENT: normal  Neck: no JVD, carotid bruits, or masses Cardiac: RRR; no murmurs, rubs, or gallops,no edema  Respiratory:  clear to auscultation bilaterally, normal work of breathing GI: soft, nontender, nondistended, + BS MS: no deformity or atrophy  Skin: warm and dry, no rash Neuro:  Alert and Oriented x 3, Strength and sensation are intact Psych: euthymic mood, full affect  Wt Readings from Last 3 Encounters:  07/11/18 218 lb 9.6 oz (99.2 kg)  01/26/18 218 lb 9.6 oz (99.2 kg)  02/01/17 214 lb 6.4 oz (97.3 kg)      Studies/Labs Reviewed:   EKG:  EKG is not ordered today.    Recent Labs: 05/21/2018: ALT 23; BUN 23; Creatinine, Ser 1.42; Potassium 4.4; Sodium 140   Lipid Panel    Component Value Date/Time   CHOL 124 05/21/2018 1152   TRIG 151 (H) 05/21/2018 1152   HDL 48 05/21/2018 1152   CHOLHDL 2.6 05/21/2018 1152   CHOLHDL 3.9 03/27/2015 1118   VLDL 37 03/27/2015 1118   LDLCALC 46 05/21/2018 1152    Additional studies/ records that were reviewed today include:   Myoview 05/24/2011     ASSESSMENT:    1. Coronary artery disease involving native coronary artery of native heart without angina pectoris   2. Hyperlipidemia with target LDL less than 70   3. Essential hypertension      PLAN:  In order of problems listed above:  1. CAD: Denies any recent chest pain.  Continue aspirin and statin  2. Hyperlipidemia: On Lipitor 80 mg daily.  Could not tolerate Zetia due to significant muscle pain.  Will recheck fasting lipid panel and liver function test in 27-month.  If still uncontrolled, will consider switching to Crestor 40 mg daily  3. Hypertension: Blood pressure well controlled on current therapy.  Amlodipine has recently discontinued due to peripheral swelling in favor of hydrochlorothiazide 25 mg daily.  He will follow-up with  his primary care provider to monitor renal function and electrolyte on the hydrochlorothiazide.  His recent creatinine has been trending up slightly.    Medication Adjustments/Labs and Tests Ordered: Current medicines are reviewed at length with the patient today.  Concerns regarding medicines are outlined above.  Medication changes, Labs and Tests ordered today are listed in the Patient Instructions below. Patient Instructions  Medication Instructions:  Your physician recommends that you continue on your current medications as directed. Please refer to the Current Medication list given to you today.  If you need a refill on your cardiac medications before your next appointment, please call your pharmacy.   Lab work: Your physician recommends that you return for a FASTING lipid profile and hepatic panel in 3 months  If you have labs (blood work) drawn today and your tests are completely normal, you will receive your results only by: Marland Kitchen MyChart Message (if you have MyChart) OR . A paper copy in the mail If you have any lab test that is abnormal or we need to change your treatment, we will call you to review the results.  Testing/Procedures: None ordered  Follow-Up: At [facility], you and your health needs are our priority.  As part of our continuing mission to provide you with exceptional heart care, we have created designated Provider Care Teams.  These Care Teams include your primary Cardiologist (physician) and Advanced Practice Providers (APPs -  Physician Assistants and Nurse Practitioners) who all work together to provide you  with the care you need, when you need it. You will need a follow up appointment in 6 months.  Please call our office 2 months in advance to schedule this appointment.  You may see Dr.Kelly or one of the following Advanced Practice Providers on your designated Care Team: Almyra Deforest, [place] . Fabian Sharp, PA-C  Any Other Special Instructions Will Be Listed Below  (If Applicable).       Hilbert Corrigan, [place]  07/11/2018 1:26 PM    [facility] [address], [place], [place]  [address] Phone: [phone]; Fax: [phone]

## 2018-07-11 NOTE — Patient Instructions (Signed)
Medication Instructions:  Your physician recommends that you continue on your current medications as directed. Please refer to the Current Medication list given to you today.  If you need a refill on your cardiac medications before your next appointment, please call your pharmacy.   Lab work: Your physician recommends that you return for a FASTING lipid profile and hepatic panel in 3 months  If you have labs (blood work) drawn today and your tests are completely normal, you will receive your results only by: Marland Kitchen MyChart Message (if you have MyChart) OR . A paper copy in the mail If you have any lab test that is abnormal or we need to change your treatment, we will call you to review the results.  Testing/Procedures: None ordered  Follow-Up: At [facility], you and your health needs are our priority.  As part of our continuing mission to provide you with exceptional heart care, we have created designated Provider Care Teams.  These Care Teams include your primary Cardiologist (physician) and Advanced Practice Providers (APPs -  Physician Assistants and Nurse Practitioners) who all work together to provide you with the care you need, when you need it. You will need a follow up appointment in 6 months.  Please call our office 2 months in advance to schedule this appointment.  You may see Dr.Kelly or one of the following Advanced Practice Providers on your designated Care Team: Almyra Deforest, [place] . Fabian Sharp, PA-C  Any Other Special Instructions Will Be Listed Below (If Applicable).

## 2019-01-17 ENCOUNTER — Telehealth: Payer: Self-pay | Admitting: Cardiovascular Disease

## 2019-01-17 NOTE — Telephone Encounter (Signed)
Attempted to reach pt to reschedule appointment with Dr. Claiborne Billings currently scheduled on 4/28. Unable to get through. Will send MyChart message as well.

## 2019-01-22 ENCOUNTER — Ambulatory Visit: Payer: Medicare Other | Admitting: Cardiovascular Disease

## 2019-02-02 ENCOUNTER — Other Ambulatory Visit: Payer: Self-pay | Admitting: Cardiovascular Disease

## 2019-02-25 ENCOUNTER — Other Ambulatory Visit: Payer: Self-pay | Admitting: Neurosurgery

## 2019-02-25 DIAGNOSIS — M961 Postlaminectomy syndrome, not elsewhere classified: Secondary | ICD-10-CM

## 2019-03-06 ENCOUNTER — Other Ambulatory Visit: Payer: Self-pay

## 2019-03-06 ENCOUNTER — Ambulatory Visit
Admission: RE | Admit: 2019-03-06 | Discharge: 2019-03-06 | Disposition: A | Discharge status: Home / Self Care | Payer: Medicare Other | Source: Ambulatory Visit | Attending: Neurosurgery | Admitting: Neurosurgery

## 2019-03-06 DIAGNOSIS — M961 Postlaminectomy syndrome, not elsewhere classified: Secondary | ICD-10-CM

## 2019-04-10 ENCOUNTER — Other Ambulatory Visit: Payer: Self-pay | Admitting: Family Medicine

## 2019-04-10 DIAGNOSIS — N6320 Unspecified lump in the left breast, unspecified quadrant: Secondary | ICD-10-CM

## 2019-04-12 ENCOUNTER — Ambulatory Visit: Payer: Medicare Other

## 2019-04-12 ENCOUNTER — Other Ambulatory Visit: Payer: Self-pay

## 2019-04-12 ENCOUNTER — Ambulatory Visit
Admission: RE | Admit: 2019-04-12 | Discharge: 2019-04-12 | Disposition: A | Discharge status: Home / Self Care | Payer: Medicare Other | Source: Ambulatory Visit | Attending: Family Medicine | Admitting: Family Medicine

## 2019-04-12 DIAGNOSIS — N6320 Unspecified lump in the left breast, unspecified quadrant: Secondary | ICD-10-CM

## 2019-05-23 ENCOUNTER — Ambulatory Visit (INDEPENDENT_AMBULATORY_CARE_PROVIDER_SITE_OTHER): Payer: Medicare Other | Admitting: Cardiovascular Disease

## 2019-05-23 ENCOUNTER — Other Ambulatory Visit: Payer: Self-pay

## 2019-05-23 DIAGNOSIS — Z79899 Other long term (current) drug therapy: Secondary | ICD-10-CM

## 2019-05-23 DIAGNOSIS — E669 Obesity, unspecified: Secondary | ICD-10-CM

## 2019-05-23 DIAGNOSIS — M549 Dorsalgia, unspecified: Secondary | ICD-10-CM

## 2019-05-23 DIAGNOSIS — E785 Hyperlipidemia, unspecified: Secondary | ICD-10-CM | POA: Diagnosis not present

## 2019-05-23 DIAGNOSIS — I10 Essential (primary) hypertension: Secondary | ICD-10-CM | POA: Diagnosis not present

## 2019-05-23 DIAGNOSIS — I2510 Atherosclerotic heart disease of native coronary artery without angina pectoris: Secondary | ICD-10-CM

## 2019-05-23 NOTE — Patient Instructions (Addendum)
Medication Instructions:  The current medical regimen is effective;  continue present plan and medications as directed. Please refer to the Current Medication list given to you today. If you need a refill on your cardiac medications before your next appointment, please call your pharmacy.  Labwork: CMET AND FLP 1 WEEK BEFORE FOLLOW UP APPT  HERE IN OUR OFFICE AT [facility]    Take the provided lab slips with you to the lab for your blood draw.   When you have your labs (blood work) drawn today and your tests are completely normal, you will receive your results only by MyChart Message (if you have MyChart) -OR-  A paper copy in the mail.  If you have any lab test that is abnormal or we need to change your treatment, we will call you to review these results.  Follow-Up: You will need a follow up appointment in 6 months.  Please call our office 2 months in advance, December 2020 to schedule this appointment.  You may see Shelva Majestic, MD or one of the following Advanced Practice Providers on your designated Care Team: [place], [place] . Fabian Sharp, PA-C     At [facility], you and your health needs are our priority.  As part of our continuing mission to provide you with exceptional heart care, we have created designated Provider Care Teams.  These Care Teams include your primary Cardiologist (physician) and Advanced Practice Providers (APPs -  Physician Assistants and Nurse Practitioners) who all work together to provide you with the care you need, when you need it.  Thank you for choosing [facility] at [facility]!!

## 2019-05-23 NOTE — Progress Notes (Signed)
Patient ID: Sean Fritz, male   DOB: [birth year], 63 y.o.   MRN: [id number]     HPI: Sean Fritz is a 63 y.o. male with known coronary artery disease who presents to the office today for an 15 month followup cardiology evaluation.   Sean Fritz has known CAD and suffered a non-Q-wave MI and underwent initial PTCA of his mid distal RCA as well as proximal circumflex coronary artery in January 1998. In July 1998 he developed restenosis of his proximal RCA and underwent stenting of his RCA. In March 2011 he underwent stenting of a 90% diagonal stenosis. Additional problems include mixed hyperlipidemia with low HDL levels and high triglycerides consistent with an atherogenic dyslipidemia pattern, hypertension, degenerative joint disease, as well as mild obesity.   On 08/07/2013 he underwent L4-L5 redo laminectomy and has noticed significant resolution in some of his previous low extremity symptomatology. His back pain initially improved which resulted in improved ambulation.  He does have issues with urinary incontinence and there is remote history of prostate CA. He tolerated his back surgery from a cardiac standpoint without compromise.   When I last saw him in May 2018 he was stable from a cardiac standpoint without recurrent anginal symptoms  He had lost weight down to 198 pounds, but has regained some of this weight back.  He sees Dr. Sherrell Puller, for primary care, was part of the [facility].  Laboratory from 12/13/2016: Total cholesterol was 137, triglycerides 179, HDL 41, LDL 60.  CBC was normal.  Potassium 4.7.  BUN 24, creatinine 1.36.  PSA was less than 0.01.   I last saw him in May 2019.  At that time he denied any chest pain or shortness of breath.  Since he is single he typically cooks for himself and at the time he was often eating hamburgers at least 3-4 times per week.  He was continuing to have some difficulty with low back discomfort.  Recent lab work at [facility] on December 22, 2017.  Cholesterol was 154, triglycerides 171, HDL 39, and LDL was 81.  Creatinine was 1.35.   Since I last saw him, he was seen in the office by Almyra Deforest, [facility] in October 2019.  He was on atorvastatin 80 mg and apparently did not tolerate Zetia.  Amlodipine had been discontinued due to swelling and he was started on HCTZ.  Presently, he denies any chest pain or shortness of breath.  His swelling improved with discontinuance of amlodipine.  He felt the Zetia contributed to his back discomfort leading to its discontinuance.  He sees Dr. Foye Deer for primary care.  Recent laboratory from April 09, 2019 was reviewed.  BUN 21 creatinine 1.23 glucose 89.  Potassium was 4.3.  Total cholesterol is 151, LDL cholesterol 75.  This was not fasting since the patient had lunch 3 hours prior to the laboratory and triglycerides were mildly elevated at 186.  He denies palpitations, presyncope or syncope.  He presents for evaluation.  Past Medical History:  Diagnosis Date  . Cancer [facility])    Prostate   . Constipation due to pain medication   . Coronary artery disease   . Degenerative joint disease   . Depression   . Hypertension   . Inability to control urination    d/t prostate surgery; pt wears pad  . Mixed hyperlipidemia   . Myocardial infarction ([place]) 1998    stress Lexiscan Normal; Low risk scan. EF is 58%. Echo 12/07/09/  normal EF > 55%   . Rosacea    on nose    Past Surgical History:  Procedure Laterality Date  . BACK SURGERY  Nov 2009  . CORONARY ANGIOPLASTY WITH STENT PLACEMENT  1998 & 12/01/09   2.25 x46m Taxus Atom Liberte DES stent-diagnal vessel with cutting balloon arthrotomy.  . ELBOW SURGERY Left July 2010  . PROSTATE SURGERY    . TONSILLECTOMY      Allergies  Allergen Reactions  . Duloxetine Hcl Other (See Comments)    "pass out" Other reaction(s): Other "pass out"  . Duloxetine     Other reaction(s): Other (See Comments) "pass out" "pass out"   . No Known Drug Allergy      Current Outpatient Medications  Medication Sig Dispense Refill  . aspirin 81 MG tablet Take 81 mg by mouth daily.     .Marland Kitchenatorvastatin (LIPITOR) 80 MG tablet TAKE 1 TABLET BY MOUTH  DAILY 90 tablet 3  . buPROPion (WELLBUTRIN XL) 150 MG 24 hr tablet Take 150 mg by mouth 3 (three) times daily.    . clopidogrel (PLAVIX) 75 MG tablet TAKE 1 TABLET BY MOUTH  DAILY 90 tablet 3  . diphenhydramine-acetaminophen (TYLENOL PM) 25-500 MG TABS Take 2 tablets by mouth at bedtime.    .Mariane BaumgartenSodium (DSS) 100 MG CAPS Take 100 mg by mouth daily as needed.    .Marland KitchenEPIPEN 2-PAK 0.3 MG/0.3ML SOAJ injection     . hydrochlorothiazide (HYDRODIURIL) 25 MG tablet Take 25 mg by mouth daily.    .Marland KitchenHYDROcodone-acetaminophen (NORCO) 10-325 MG per tablet Take 0.5-1 tablets by mouth 4 (four) times daily.     . isosorbide mononitrate (IMDUR) 60 MG 24 hr tablet TAKE 1 TABLET BY MOUTH  DAILY 90 tablet 1  . metoprolol succinate (TOPROL-XL) 50 MG 24 hr tablet TAKE 2 TABLETS BY MOUTH  DAILY. TAKE WITH OR  IMMEDIATELY FOLLOWING A  MEAL. 180 tablet 1  . nitroGLYCERIN (NITROSTAT) 0.4 MG SL tablet Place 1 tablet (0.4 mg total) under the tongue as needed. 30 tablet 2  . PROCTOSOL HC 2.5 % rectal cream Apply 1 application topically as needed.    . ramipril (ALTACE) 5 MG capsule TAKE 1 CAPSULE BY MOUTH  DAILY 90 capsule 1  . tiZANidine (ZANAFLEX) 4 MG tablet Take 1 tablet by mouth as needed.  0  . topiramate (TOPAMAX) 50 MG tablet Take 50 mg by mouth 2 (two) times daily.    .Marland KitchenFLUoxetine (PROZAC) 20 MG capsule Take 20 mg by mouth daily.     No current facility-administered medications for this visit.     Socially he is divorced. There is a remote tobacco history. He does try to exercise but he has been limited by his mild residual back discomfort .  ROS General: Negative; No fevers, chills, or night sweats;  HEENT: Negative; No changes in vision or hearing, sinus congestion, difficulty swallowing Pulmonary: Negative; No cough,  wheezing, shortness of breath, hemoptysis Cardiovascular: Negative; No chest pain, presyncope, syncope, palpatations GI: Negative; No nausea, vomiting, diarrhea, or abdominal pain GU: History of robotic prostate removal secondary to prostate CA with residual urinary incontinence; difficulty with erectile function; No dysuria, hematuria, or difficulty voiding Musculoskeletal: Positive for lumbar disc disease, status post L4-L5 spinal fusion; positive for hip discomfort  Hematologic/Oncology: Negative; no easy bruising, bleeding Endocrine: Probable metabolic syndrome; no heat/cold intolerance; no diabetes Neuro: Negative; no changes in balance, headaches Skin: Negative; No rashes or skin lesions Psychiatric: Negative; No behavioral  problems, depression Sleep: Negative; No snoring, daytime sleepiness, hypersomnolence, bruxism, restless legs, hypnogognic hallucinations, no cataplexy Other comprehensive 14 point system review is negative.   PE BP 117/69   Pulse 60   Ht 5' 8" (1.727 m)   Wt 216 lb (98 kg)   SpO2 97%   BMI 32.84 kg/m    Repeat blood pressure by me was 134/70 supine 126/70 standing  Wt Readings from Last 3 Encounters:  05/23/19 216 lb (98 kg)  07/11/18 218 lb 9.6 oz (99.2 kg)  01/26/18 218 lb 9.6 oz (99.2 kg)   General: Alert, oriented, no distress.  Skin: normal turgor, no rashes, warm and dry HEENT: Normocephalic, atraumatic. Pupils equal round and reactive to light; sclera anicteric; extraocular muscles intact;  Nose without nasal septal hypertrophy Mouth/Parynx benign; Mallinpatti scale 3 Neck: No JVD, no carotid bruits; normal carotid upstroke Lungs: clear to ausculatation and percussion; no wheezing or rales Chest wall: without tenderness to palpitation Heart: PMI not displaced, RRR, s1 s2 normal, 1/6 systolic murmur, no diastolic murmur, no rubs, gallops, thrills, or heaves Abdomen: soft, nontender; no hepatosplenomehaly, BS+; abdominal aorta nontender and not  dilated by palpation. Back: no CVA tenderness Pulses 2+ Musculoskeletal: full range of motion, normal strength, no joint deformities Extremities: no clubbing cyanosis or edema, Homan's sign negative  Neurologic: grossly nonfocal; Cranial nerves grossly wnl Psychologic: Normal mood and affect   ECG (independently read by me): Normal sinus rhythm at 60 with PAC.  No ST segment changes.  Normal intervals  May 2019 ECG (independently read by me): Sinus rhythm at 62 bpm.  No ectopy.  No ST segment changes.  May 2018 ECG (independently read by me): Normal sinus rhythm at 68 bpm.  Normal intervals.  No significant ST changes.  April 2017 ECG (independently read by me): Normal sinus rhythm at 63 bpm.  No ectopy.  Normal intervals.  Small Q wave in lead 3.  July 2016 ECG (independently read by me):  Normal sinus rhythm at 70 bpm..  No ectopy.  No significant ST changes.  December 2015ECG (independently read by me): normal sinus rhythm at 62 bpm.  PR interval 180 ms.  QTc interval 420 ms.  June 2015 ECG (independently read by me): Normal sinus rhythm at 69 beats per minute.  No significant ST-T change  ECG: Sinus rhythm at 70 beats per minute. No ectopy. Intervals normal.  LABS: I have personally reviewed the laboratory results done by Dr. Coletta Memos from 12/13/2016 as outlined above.  I reviewed laboratory from [facility] from December 22, 2017.  I personally reviewed laboratory from April 10, 2019   BMP Latest Ref Rng & Units 05/21/2018 04/27/2015 03/27/2015  Glucose 65 - 99 mg/dL 98 111(H) 103(H)  BUN 8 - 27 mg/dL 23 23 25(H)  Creatinine 0.76 - 1.27 mg/dL 1.42(H) 1.19 1.41(H)  BUN/Creat Ratio 10 - 24 16 - -  Sodium 134 - 144 mmol/L 140 141 141  Potassium 3.5 - 5.2 mmol/L 4.4 4.1 4.2  Chloride 96 - 106 mmol/L 104 108 107  CO2 20 - 29 mmol/L _0 Calcium 8.6 - 10.2 mg/dL 9.5 9.3 8.7   Hepatic Function Latest Ref Rng & Units 05/21/2018 03/27/2015 02/24/2014  Total  Protein 6.0 - 8.5 g/dL 6.8 6.2 6.5  Albumin 3.6 - 4.8 g/dL 4.9(H) 4.3 4.6  AST 0 - 40 IU/L _1 ALT 0 - 44 IU/L _2 Alk Phosphatase 39 - 117 IU/L  123(H) 90 104  Total Bilirubin 0.0 - 1.2 mg/dL 0.4 0.5 0.5   CBC Latest Ref Rng & Units 03/27/2015 02/24/2014 08/10/2013  WBC 4.0 - 10.5 K/uL 7.3 7.7 12.6(H)  Hemoglobin 13.0 - 17.0 g/dL 13.8 13.3 10.5(L)  Hematocrit 39.0 - 52.0 % 41.7 39.3 29.5(L)  Platelets 150 - 400 K/uL 206 205 179   Lab Results  Component Value Date   MCV 88.9 03/27/2015   MCV 86.9 02/24/2014   MCV 87.8 08/10/2013   Lab Results  Component Value Date   TSH 1.710 03/27/2015   Lipid Panel     Component Value Date/Time   CHOL 124 05/21/2018 1152   TRIG 151 (H) 05/21/2018 1152   HDL 48 05/21/2018 1152   CHOLHDL 2.6 05/21/2018 1152   CHOLHDL 3.9 03/27/2015 1118   VLDL 37 03/27/2015 1118   LDLCALC 46 05/21/2018 1152    IMPRESSION:  1. Essential hypertension   2. CAD in native artery   3. Hyperlipidemia with target LDL less than 70   4. Discomfort of back   5. Mild obesity   6. Medication management     ASSESSMENT AND PLAN: SeanFritz is a 63 year old white male who has CAD and is s/p remote intervention to his right coronary artery, circumflex vessel, as well as diagonal vessels.  He continues to be stable without recurrent anginal symptoms.  Due to development of leg edema, he is no longer on amlodipine but remains angina free with good blood pressure on ramipril 5 mg daily, Toprol-XL 100 mg, isosorbide 60 mg, in addition to HCTZ 25 mg.  He continues to be on aspirin and Plavix and there is no bleeding.  He is on atorvastatin 80 mg for hyperlipidemia with most recent LDL at 75.  Remotely he did not tolerate Zetia but I am not certain if this was contributing to his back discomfort which has been a chronic problem.  He continues to be on fluoxetine.  Laboratory was reviewed.  He believes his diet has improved from when I saw him last.  BMI still increased to  32.84.  Weight loss and increased exercise was recommended per AHA guidelines.  In 6 months, I have recommending repeat chemistry and lipid studies and will see him back in the office for follow-up evaluation. Time spent: 25 minutes  Troy Sine, MD, [facility]  05/25/2019 10:03 AM

## 2019-05-25 ENCOUNTER — Encounter: Payer: Self-pay | Admitting: Cardiovascular Disease

## 2019-07-09 ENCOUNTER — Other Ambulatory Visit: Payer: Self-pay | Admitting: Cardiovascular Disease

## 2019-12-18 ENCOUNTER — Other Ambulatory Visit: Payer: Self-pay | Admitting: Cardiovascular Disease

## 2020-01-06 ENCOUNTER — Other Ambulatory Visit: Payer: Self-pay | Admitting: *Deleted

## 2020-01-10 ENCOUNTER — Other Ambulatory Visit: Payer: Self-pay

## 2020-01-16 LAB — HEPATIC FUNCTION PANEL
ALT: 19 IU/L (ref 0–44)
AST: 18 IU/L (ref 0–40)
Albumin: 4.9 g/dL — ABNORMAL HIGH (ref 3.8–4.8)
Alkaline Phosphatase: 119 IU/L — ABNORMAL HIGH (ref 39–117)
Bilirubin Total: 0.5 mg/dL (ref 0.0–1.2)
Bilirubin, Direct: 0.13 mg/dL (ref 0.00–0.40)
Total Protein: 7 g/dL (ref 6.0–8.5)

## 2020-01-16 LAB — LIPID PANEL
Chol/HDL Ratio: 3.6 ratio (ref 0.0–5.0)
Cholesterol, Total: 145 mg/dL (ref 100–199)
HDL: 40 mg/dL (ref >39)
LDL Chol Calc (NIH): 75 mg/dL (ref 0–99)
Triglycerides: 175 mg/dL — ABNORMAL HIGH (ref 0–149)
VLDL Cholesterol Cal: 30 mg/dL (ref 5–40)

## 2020-01-16 NOTE — Progress Notes (Signed)
Bad cholesterol (LDL) and triglyceride right over the borderline, ok to continue the current therapy

## 2020-01-29 ENCOUNTER — Ambulatory Visit: Payer: Medicare Other | Admitting: Cardiovascular Disease

## 2020-01-29 ENCOUNTER — Other Ambulatory Visit: Payer: Self-pay

## 2020-01-29 ENCOUNTER — Encounter: Payer: Self-pay | Admitting: Cardiovascular Disease

## 2020-01-29 VITALS — BP 102/58 | HR 62 | Ht 68.0 in | Wt 212.8 lb

## 2020-01-29 DIAGNOSIS — I2510 Atherosclerotic heart disease of native coronary artery without angina pectoris: Secondary | ICD-10-CM

## 2020-01-29 DIAGNOSIS — M549 Dorsalgia, unspecified: Secondary | ICD-10-CM

## 2020-01-29 DIAGNOSIS — I10 Essential (primary) hypertension: Secondary | ICD-10-CM

## 2020-01-29 DIAGNOSIS — E669 Obesity, unspecified: Secondary | ICD-10-CM

## 2020-01-29 DIAGNOSIS — E785 Hyperlipidemia, unspecified: Secondary | ICD-10-CM

## 2020-01-29 DIAGNOSIS — Z79899 Other long term (current) drug therapy: Secondary | ICD-10-CM | POA: Diagnosis not present

## 2020-01-29 MED ORDER — HYDROCHLOROTHIAZIDE 12.5 MG PO CAPS: 12.5000 mg | ORAL_CAPSULE | Freq: Every day | ORAL | 3 refills | Status: DC

## 2020-01-29 NOTE — Patient Instructions (Signed)
Medication Instructions:  DECREASE YOUR HCTZ TO 12.5MG  DAILY (1 CAPSULE DAILY) *If you need a refill on your cardiac medications before your next appointment, please call your pharmacy*    Follow-Up: At [facility], you and your health needs are our priority.  As part of our continuing mission to provide you with exceptional heart care, we have created designated Provider Care Teams.  These Care Teams include your primary Cardiologist (physician) and Advanced Practice Providers (APPs -  Physician Assistants and Nurse Practitioners) who all work together to provide you with the care you need, when you need it.  We recommend signing up for the patient portal called "MyChart".  Sign up information is provided on this After Visit Summary.  MyChart is used to connect with patients for Virtual Visits (Telemedicine).  Patients are able to view lab/test results, encounter notes, upcoming appointments, etc.  Non-urgent messages can be sent to your provider as well.   To learn more about what you can do with MyChart, go to [email or web address].    Your next appointment:   6 month(s)  The format for your next appointment:   In Person  Provider:   Shelva Majestic, MD

## 2020-01-29 NOTE — Progress Notes (Signed)
Patient ID: Sean Fritz, male   DOB: 27-Feb-1956, 64 y.o.   MRN: [id number]     HPI: Sean Fritz is a 64 y.o. male with known coronary artery disease who presents to the office today for a 9 month followup cardiology evaluation.   Sean Fritz has known CAD and suffered a non-Q-wave MI and underwent initial PTCA of his mid distal RCA as well as proximal circumflex coronary artery in January 1998. In July 1998 he developed restenosis of his proximal RCA and underwent stenting of his RCA. In March 2011 he underwent stenting of a 90% diagonal stenosis. Additional problems include mixed hyperlipidemia with low HDL levels and high triglycerides consistent with an atherogenic dyslipidemia pattern, hypertension, degenerative joint disease, as well as mild obesity.   On 08/07/2013 he underwent L4-L5 redo laminectomy and has noticed significant resolution in some of his previous low extremity symptomatology. His back pain initially improved which resulted in improved ambulation.  He does have issues with urinary incontinence and there is remote history of prostate CA. He tolerated his back surgery from a cardiac standpoint without compromise.   When I last saw him in May 2018 he was stable from a cardiac standpoint without recurrent anginal symptoms  He had lost weight down to 198 pounds, but has regained some of this weight back.  He sees Dr. Sherrell Puller, for primary care, was part of the [facility].  Laboratory from 12/13/2016: Total cholesterol was 137, triglycerides 179, HDL 41, LDL 60.  CBC was normal.  Potassium 4.7.  BUN 24, creatinine 1.36.  PSA was less than 0.01.   I last saw him in May 2019.  At that time he denied any chest pain or shortness of breath.  Since he is single he typically cooks for himself and at the time he was often eating hamburgers at least 3-4 times per week.  He was continuing to have some difficulty with low back discomfort.  Recent lab work at [facility] on December 22, 2017.  Cholesterol was 154, triglycerides 171, HDL 39, and LDL was 81.  Creatinine was 1.35.   He was seen in the office by Almyra Deforest, [facility] in October 2019.  He was on atorvastatin 80 mg and apparently did not tolerate Zetia.  Amlodipine had been discontinued due to swelling and he was started on HCTZ.  I last saw him in August 2020 at which time he denied any any chest pain or shortness of breath.  His swelling improved with discontinuance of amlodipine.  He felt the Zetia contributed to his back discomfort leading to its discontinuance.  He sees Dr. Coletta Memos for primary care.  Laboratory from April 09, 2019 was reviewed.  BUN 21 creatinine 1.23 glucose 89.  Potassium was 4.3.  Total cholesterol is 151, LDL cholesterol 75.  This was not fasting since the patient had lunch 3 hours prior to the laboratory and triglycerides were mildly elevated at 186.  He denies palpitations, presyncope or syncope.    Since his last evaluation, he has continued to feel well.  He continues to have difficulty with his back.  He specifically denies any chest pain or shortness of breath.  He typically walks 30 to 40 minutes a day.  He is no longer on Zetia since he felt this contributed to some of his back problems.  He denies any edema.  There is no angina.  He denies PND orthopnea.  He presents for evaluation.  Past Medical History:  Diagnosis Date  . Cancer [facility])    Prostate   . Constipation due to pain medication   . Coronary artery disease   . Degenerative joint disease   . Depression   . Hypertension   . Inability to control urination    d/t prostate surgery; pt wears pad  . Mixed hyperlipidemia   . Myocardial infarction ([place]) 1998    stress Lexiscan Normal; Low risk scan. EF is 58%. Echo 12/07/09/ normal EF > 55%   . Rosacea    on nose    Past Surgical History:  Procedure Laterality Date  . BACK SURGERY  Nov 2009  . CORONARY ANGIOPLASTY WITH STENT PLACEMENT  1998 & 12/01/09   2.25 x59m Taxus Atom  Liberte DES stent-diagnal vessel with cutting balloon arthrotomy.  . ELBOW SURGERY Left July 2010  . PROSTATE SURGERY    . TONSILLECTOMY      Allergies  Allergen Reactions  . Duloxetine Hcl Other (See Comments)    "pass out" Other reaction(s): Other "pass out"  . Duloxetine     Other reaction(s): Other (See Comments) "pass out" "pass out"   . No Known Drug Allergy     Current Outpatient Medications  Medication Sig Dispense Refill  . amLODipine (NORVASC) 10 MG tablet TAKE 1 TABLET BY MOUTH  DAILY 30 tablet 0  . aspirin 81 MG tablet Take 81 mg by mouth daily.     .Marland Kitchenatorvastatin (LIPITOR) 80 MG tablet TAKE 1 TABLET BY MOUTH  DAILY 90 tablet 2  . buPROPion (WELLBUTRIN XL) 150 MG 24 hr tablet Take 150 mg by mouth 3 (three) times daily.    . clopidogrel (PLAVIX) 75 MG tablet TAKE 1 TABLET BY MOUTH  DAILY 90 tablet 3  . diphenhydramine-acetaminophen (TYLENOL PM) 25-500 MG TABS Take 2 tablets by mouth at bedtime.    .Mariane BaumgartenSodium (DSS) 100 MG CAPS Take 100 mg by mouth daily as needed.    .Marland KitchenEPIPEN 2-PAK 0.3 MG/0.3ML SOAJ injection     . FLUoxetine (PROZAC) 20 MG capsule Take 20 mg by mouth daily.    .Marland KitchenHYDROcodone-acetaminophen (NORCO) 10-325 MG per tablet Take 0.5-1 tablets by mouth 4 (four) times daily.     . isosorbide mononitrate (IMDUR) 60 MG 24 hr tablet TAKE 1 TABLET BY MOUTH  DAILY 90 tablet 3  . metoprolol succinate (TOPROL-XL) 50 MG 24 hr tablet TAKE 2 TABLETS BY MOUTH  DAILY. TAKE WITH OR  IMMEDIATELY FOLLOWING A  MEAL. 180 tablet 3  . nitroGLYCERIN (NITROSTAT) 0.4 MG SL tablet Place 1 tablet (0.4 mg total) under the tongue as needed. 30 tablet 2  . PROCTOSOL HC 2.5 % rectal cream Apply 1 application topically as needed.    . ramipril (ALTACE) 5 MG capsule TAKE 1 CAPSULE BY MOUTH  DAILY 90 capsule 3  . tiZANidine (ZANAFLEX) 4 MG tablet Take 1 tablet by mouth as needed.  0  . topiramate (TOPAMAX) 25 MG tablet     . topiramate (TOPAMAX) 50 MG tablet Take 50 mg by mouth 2  (two) times daily.    . hydrochlorothiazide (MICROZIDE) 12.5 MG capsule Take 1 capsule (12.5 mg total) by mouth daily. 90 capsule 3   No current facility-administered medications for this visit.    Socially he is divorced. There is a remote tobacco history. He does try to exercise but he has been limited by his mild residual back discomfort .  ROS General: Negative; No fevers, chills, or night sweats;  HEENT: Negative;  No changes in vision or hearing, sinus congestion, difficulty swallowing Pulmonary: Negative; No cough, wheezing, shortness of breath, hemoptysis Cardiovascular: Negative; No chest pain, presyncope, syncope, palpatations GI: Negative; No nausea, vomiting, diarrhea, or abdominal pain GU: History of robotic prostate removal secondary to prostate CA with residual urinary incontinence; difficulty with erectile function; No dysuria, hematuria, or difficulty voiding Musculoskeletal: Positive for lumbar disc disease, status post L4-L5 spinal fusion; positive for hip discomfort  Hematologic/Oncology: Negative; no easy bruising, bleeding Endocrine: Probable metabolic syndrome; no heat/cold intolerance; no diabetes Neuro: Negative; no changes in balance, headaches Skin: Negative; No rashes or skin lesions Psychiatric: Negative; No behavioral problems, depression Sleep: Negative; No snoring, daytime sleepiness, hypersomnolence, bruxism, restless legs, hypnogognic hallucinations, no cataplexy Other comprehensive 14 point system review is negative.   PE BP (!) 102/58   Pulse 62   Ht '5\' 8"'  (1.727 m)   Wt 212 lb 12.8 oz (96.5 kg)   SpO2 97%   BMI 32.36 kg/m    Repeat blood pressure was 120/76  Wt Readings from Last 3 Encounters:  01/29/20 212 lb 12.8 oz (96.5 kg)  05/23/19 216 lb (98 kg)  07/11/18 218 lb 9.6 oz (99.2 kg)   General: Alert, oriented, no distress.  Skin: normal turgor, no rashes, warm and dry HEENT: Normocephalic, atraumatic. Pupils equal round and reactive to  light; sclera anicteric; extraocular muscles intact;  Nose without nasal septal hypertrophy Mouth/Parynx benign; Mallinpatti scale 3 Neck: No JVD, no carotid bruits; normal carotid upstroke Lungs: clear to ausculatation and percussion; no wheezing or rales Chest wall: without tenderness to palpitation Heart: PMI not displaced, RRR, s1 s2 normal, 1/6 systolic murmur, no diastolic murmur, no rubs, gallops, thrills, or heaves Abdomen: soft, nontender; no hepatosplenomehaly, BS+; abdominal aorta nontender and not dilated by palpation. Back: no CVA tenderness Pulses 2+ Musculoskeletal: full range of motion, normal strength, no joint deformities Extremities: Varicosities; no clubbing cyanosis or edema, Homan's sign negative  Neurologic: grossly nonfocal; Cranial nerves grossly wnl Psychologic: Normal mood and affect   ECG (independently read by me): NSR at 62; normal intervals  August 2020 ECG (independently read by me): Normal sinus rhythm at 60 with PAC.  No ST segment changes.  Normal intervals  May 2019 ECG (independently read by me): Sinus rhythm at 62 bpm.  No ectopy.  No ST segment changes.  May 2018 ECG (independently read by me): Normal sinus rhythm at 68 bpm.  Normal intervals.  No significant ST changes.  April 2017 ECG (independently read by me): Normal sinus rhythm at 63 bpm.  No ectopy.  Normal intervals.  Small Q wave in lead 3.  July 2016 ECG (independently read by me):  Normal sinus rhythm at 70 bpm..  No ectopy.  No significant ST changes.  December 2015ECG (independently read by me): normal sinus rhythm at 62 bpm.  PR interval 180 ms.  QTc interval 420 ms.  June 2015 ECG (independently read by me): Normal sinus rhythm at 69 beats per minute.  No significant ST-T change  ECG: Sinus rhythm at 70 beats per minute. No ectopy. Intervals normal.  LABS: I have personally reviewed the laboratory results done by Dr. Coletta Memos from 12/13/2016 as outlined above.  I reviewed  laboratory from [facility] [place] from December 22, 2017.  I personally reviewed laboratory from April 10, 2019   BMP Latest Ref Rng & Units 05/21/2018 04/27/2015 03/27/2015  Glucose 65 - 99 mg/dL 98 111(H) 103(H)  BUN 8 - 27 mg/dL 23  23 25(H)  Creatinine 0.76 - 1.27 mg/dL 1.42(H) 1.19 1.41(H)  BUN/Creat Ratio 10 - 24 16 - -  Sodium 134 - 144 mmol/L 140 141 141  Potassium 3.5 - 5.2 mmol/L 4.4 4.1 4.2  Chloride 96 - 106 mmol/L 104 108 107  CO2 20 - 29 mmol/L '20 22 23  ' Calcium 8.6 - 10.2 mg/dL 9.5 9.3 8.7   Hepatic Function Latest Ref Rng & Units 01/15/2020 05/21/2018 03/27/2015  Total Protein 6.0 - 8.5 g/dL 7.0 6.8 6.2  Albumin 3.8 - 4.8 g/dL 4.9(H) 4.9(H) 4.3  AST 0 - 40 IU/L '18 17 14  ' ALT 0 - 44 IU/L '19 23 22  ' Alk Phosphatase 39 - 117 IU/L 119(H) 123(H) 90  Total Bilirubin 0.0 - 1.2 mg/dL 0.5 0.4 0.5  Bilirubin, Direct 0.00 - 0.40 mg/dL 0.13 - -   CBC Latest Ref Rng & Units 03/27/2015 02/24/2014 08/10/2013  WBC 4.0 - 10.5 K/uL 7.3 7.7 12.6(H)  Hemoglobin 13.0 - 17.0 g/dL 13.8 13.3 10.5(L)  Hematocrit 39.0 - 52.0 % 41.7 39.3 29.5(L)  Platelets 150 - 400 K/uL 206 205 179   Lab Results  Component Value Date   MCV 88.9 03/27/2015   MCV 86.9 02/24/2014   MCV 87.8 08/10/2013   Lab Results  Component Value Date   TSH 1.710 03/27/2015   Lipid Panel     Component Value Date/Time   CHOL 145 01/15/2020 1513   TRIG 175 (H) 01/15/2020 1513   HDL 40 01/15/2020 1513   CHOLHDL 3.6 01/15/2020 1513   CHOLHDL 3.9 03/27/2015 1118   VLDL 37 03/27/2015 1118   LDLCALC 75 01/15/2020 1513    IMPRESSION:  1. CAD in native artery   2. Essential hypertension   3. Medication management   4. Mild obesity   5. Hyperlipidemia with target LDL less than 70   6. Discomfort of back     ASSESSMENT AND PLAN: SeanFritz is a 64 year old white male who has CAD and is s/p remote intervention to his right coronary artery, circumflex vessel, as well as diagonal vessels.  He continues to be  stable without recurrent anginal symptoms.  Due to development of leg edema, he is no longer on amlodipine but remains angina free with good blood pressure on ramipril 5 mg daily, Toprol-XL 100 mg, isosorbide 60 mg, in addition to HCTZ 25 mg.  Times he has noted some mild dizziness.  He at times also has noted his blood pressure to be on the low side.  Presently there are no signs of edema and I am recommending reduction of his hydrochlorothiazide to 12.5 mg.  He will continue taking isosorbide 60 mg daily, metoprolol succinate 100 mg daily and ramipril 5 mg.  He has been maintained on DAPT with aspirin and Plavix and is continued to do well without recurrent anginal symptoms.  I reviewed recent laboratory.  Most recent cholesterol on January 15, 2020 was 145 with an HDL of 40 LDL 75.  Triglycerides had increased slightly to 175.  We discussed diet and weight loss with his BMI of 32.36.  He will continue current therapy.  I will see him in 6 months for reevaluation or sooner as needed.   Sean Sine, MD, [facility]  01/31/2020 7:13 PM

## 2020-01-31 ENCOUNTER — Encounter: Payer: Self-pay | Admitting: Cardiovascular Disease

## 2020-05-27 ENCOUNTER — Other Ambulatory Visit: Payer: Self-pay | Admitting: Cardiovascular Disease

## 2020-07-29 NOTE — Progress Notes (Signed)
Cardiology Office Note:    Date:  08/03/2020   ID:  Sean Fritz [birth year], MRN [id number]  PCP:  Bernerd Limbo, MD  Cardiologist:  Shelva Majestic, MD   Referring MD: Bernerd Limbo, MD   Chief Complaint  Patient presents with  . Follow-up    CAD, HTN    History of Present Illness:    Sean Fritz is a 64 y.o. male with a hx of HTN, CAD, HLD, and obesity. Pt had non-Q wave MI with PTCA of mid-distal RCA and proximal Cx Jan 1998. In July 1998 developed restenosis of proximal RCA treated with stent. March 2011 had stent of 90% diagonal stenosis. He has also had prostate cancer and back pain with prior back surgery without cardiac complications (3825). He was seen in clinic in 2019. He has not tolerated zetia in the past. Amlodipine was D/C'ed due to swelling and HCTZ started. He was last seen in clinic 01/29/20 and was doing well at that time. He is maintained on ASA, plavix, statin, amlodipine, BB, imdur, ramipril, and HCTZ.   He presents today for scheduled follow up.  He has been doing very well since reducing his HCTZ to 12.5 mg. No syncope or falls. No chest pain, dyspnea, orthopnea, lower extremity swelling, and has not taken nitro SL regularly. In the last 6 months has taken nitro three times. He struggles with back pain.  He walks his dog, had to put one dog to sleep. He walks about 4 miles per week with his dog without angina. With additional discussion, he did have a fall when he was gardening at his complex and slipped on wet grass and fell onto his side. He denies head injury. He was down for 45 min because he was unable to get up by himself due to back pain. This occurred last week. The fall was not preceded by dizziness or chest pain.   Overall, doing well from a cardiac perspective.     Past Medical History:  Diagnosis Date  . Cancer [facility])    Prostate   . Constipation due to pain medication   . Coronary artery disease   . Degenerative joint disease   . Depression   .  Hypertension   . Inability to control urination    d/t prostate surgery; pt wears pad  . Mixed hyperlipidemia   . Myocardial infarction ([place]) 1998    stress Lexiscan Normal; Low risk scan. EF is 58%. Echo 12/07/09/ normal EF > 55%   . Rosacea    on nose    Past Surgical History:  Procedure Laterality Date  . BACK SURGERY  Nov 2009  . CORONARY ANGIOPLASTY WITH STENT PLACEMENT  1998 & 12/01/09   2.25 x48mm Taxus Atom Liberte DES stent-diagnal vessel with cutting balloon arthrotomy.  . ELBOW SURGERY Left July 2010  . PROSTATE SURGERY    . TONSILLECTOMY      Current Medications: Current Meds  Medication Sig  . amLODipine (NORVASC) 10 MG tablet TAKE 1 TABLET BY MOUTH  DAILY  . aspirin 81 MG tablet Take 81 mg by mouth daily.   Marland Kitchen atorvastatin (LIPITOR) 80 MG tablet TAKE 1 TABLET BY MOUTH  DAILY  . buPROPion (WELLBUTRIN XL) 150 MG 24 hr tablet Take 150 mg by mouth 3 (three) times daily.  . clopidogrel (PLAVIX) 75 MG tablet TAKE 1 TABLET BY MOUTH  DAILY  . diphenhydramine-acetaminophen (TYLENOL PM) 25-500 MG TABS Take 2 tablets by mouth at bedtime.  . Docusate Sodium (  DSS) 100 MG CAPS Take 100 mg by mouth daily as needed.  Marland Kitchen EPIPEN 2-PAK 0.3 MG/0.3ML SOAJ injection   . HYDROcodone-acetaminophen (NORCO) 10-325 MG per tablet Take 0.5-1 tablets by mouth 4 (four) times daily.   . isosorbide mononitrate (IMDUR) 60 MG 24 hr tablet TAKE 1 TABLET BY MOUTH  DAILY  . metoprolol succinate (TOPROL-XL) 50 MG 24 hr tablet TAKE 2 TABLETS BY MOUTH  DAILY. TAKE WITH OR  IMMEDIATELY FOLLOWING A  MEAL.  . nitroGLYCERIN (NITROSTAT) 0.4 MG SL tablet Place 1 tablet (0.4 mg total) under the tongue as needed.  Marland Kitchen PROCTOSOL HC 2.5 % rectal cream Apply 1 application topically as needed.  . ramipril (ALTACE) 5 MG capsule TAKE 1 CAPSULE BY MOUTH  DAILY  . tiZANidine (ZANAFLEX) 4 MG tablet Take 1 tablet by mouth as needed.  . topiramate (TOPAMAX) 25 MG tablet   . topiramate (TOPAMAX) 50 MG tablet Take 50 mg by mouth  2 (two) times daily.     Allergies:   Duloxetine hcl, Duloxetine, and No known drug allergy   Social History   Socioeconomic History  . Marital status: Single    Spouse name: Not on file  . Number of children: Not on file  . Years of education: Not on file  . Highest education level: Not on file  Occupational History  . Not on file  Tobacco Use  . Smoking status: Former Smoker    Quit date: 09/26/1996    Years since quitting: 23.8  . Smokeless tobacco: Never Used  Substance and Sexual Activity  . Alcohol use: No  . Drug use: No  . Sexual activity: Not on file  Other Topics Concern  . Not on file  Social History Narrative  . Not on file   Social Determinants of Health   Financial Resource Strain:   . Difficulty of Paying Living Expenses: Not on file  Food Insecurity:   . Worried About Charity fundraiser in the Last Year: Not on file  . Ran Out of Food in the Last Year: Not on file  Transportation Needs:   . Lack of Transportation (Medical): Not on file  . Lack of Transportation (Non-Medical): Not on file  Physical Activity:   . Days of Exercise per Week: Not on file  . Minutes of Exercise per Session: Not on file  Stress:   . Feeling of Stress : Not on file  Social Connections:   . Frequency of Communication with Friends and Family: Not on file  . Frequency of Social Gatherings with Friends and Family: Not on file  . Attends Religious Services: Not on file  . Active Member of Clubs or Organizations: Not on file  . Attends Archivist Meetings: Not on file  . Marital Status: Not on file     Family History: The patient's family history includes Cancer in his mother; Lupus in his sister.  ROS:   Please see the history of present illness.     All other systems reviewed and are negative.  EKGs/Labs/Other Studies Reviewed:    The following studies were reviewed today:  Left heart cath 2011 - reviewed   EKG:  EKG is not ordered today.    Recent  Labs: 01/15/2020: ALT 19  Recent Lipid Panel    Component Value Date/Time   CHOL 145 01/15/2020 1513   TRIG 175 (H) 01/15/2020 1513   HDL 40 01/15/2020 1513   CHOLHDL 3.6 01/15/2020 1513   CHOLHDL 3.9 03/27/2015  1118   VLDL 37 03/27/2015 1118   LDLCALC 75 01/15/2020 1513    Physical Exam:    VS:  BP 126/70   Pulse 62   Ht 5\' 8"  (1.727 m)   Wt 210 lb (95.3 kg)   SpO2 96%   BMI 31.93 kg/m     Wt Readings from Last 3 Encounters:  08/03/20 210 lb (95.3 kg)  01/29/20 212 lb 12.8 oz (96.5 kg)  05/23/19 216 lb (98 kg)     GEN: Well nourished, well developed in no acute distress HEENT: Normal NECK: No JVD; No carotid bruits LYMPHATICS: No lymphadenopathy CARDIAC: RRR, no murmurs, rubs, gallops RESPIRATORY:  Clear to auscultation without rales, wheezing or rhonchi  ABDOMEN: Soft, non-tender, non-distended MUSCULOSKELETAL:  No edema; No deformity  SKIN: Warm and dry NEUROLOGIC:  Alert and oriented x 3 PSYCHIATRIC:  Normal affect   ASSESSMENT:    1. CAD in native artery   2. Essential hypertension   3. Hyperlipidemia with target LDL less than 70    PLAN:    In order of problems listed above:  CAD s/p remote PCIs to RCA, Cx, and diagonal - continue ASA and plavix - BB, statin, ACEI, amlodipine, HCTZ - has taken nitro 3 times in the last 6 months   Hyperlipidemia with LDL goal < 70 - intolerant to zetia (back pain) - continue high intensity statin 01/15/2020: Cholesterol, Total 145; HDL 40; LDL Chol Calc (NIH) 75; Triglycerides 175   Hypertension - continue imdur, amlodipine, BB, ACEI, and HCTZ - pressure well-controlled - no change   Have asked him to continue walking.    Medication Adjustments/Labs and Tests Ordered: Current medicines are reviewed at length with the patient today.  Concerns regarding medicines are outlined above.  No orders of the defined types were placed in this encounter.  No orders of the defined types were placed in this  encounter.   Signed, Ledora Bottcher, PA  08/03/2020 2:09 PM    [facility]

## 2020-08-03 ENCOUNTER — Other Ambulatory Visit: Payer: Self-pay

## 2020-08-03 ENCOUNTER — Ambulatory Visit: Payer: Medicare Other | Admitting: Physician Assistant

## 2020-08-03 ENCOUNTER — Encounter: Payer: Self-pay | Admitting: Physician Assistant

## 2020-08-03 VITALS — BP 126/70 | HR 62 | Ht 68.0 in | Wt 210.0 lb

## 2020-08-03 DIAGNOSIS — I10 Essential (primary) hypertension: Secondary | ICD-10-CM | POA: Diagnosis not present

## 2020-08-03 DIAGNOSIS — I2510 Atherosclerotic heart disease of native coronary artery without angina pectoris: Secondary | ICD-10-CM | POA: Diagnosis not present

## 2020-08-03 DIAGNOSIS — E785 Hyperlipidemia, unspecified: Secondary | ICD-10-CM

## 2020-08-03 NOTE — Patient Instructions (Signed)
Medication Instructions:  No Changes *If you need a refill on your cardiac medications before your next appointment, please call your pharmacy*   Lab Work: No labs If you have labs (blood work) drawn today and your tests are completely normal, you will receive your results only by: Marland Kitchen MyChart Message (if you have MyChart) OR . A paper copy in the mail If you have any lab test that is abnormal or we need to change your treatment, we will call you to review the results.   Testing/Procedures: No Testing   Follow-Up: At [facility], you and your health needs are our priority.  As part of our continuing mission to provide you with exceptional heart care, we have created designated Provider Care Teams.  These Care Teams include your primary Cardiologist (physician) and Advanced Practice Providers (APPs -  Physician Assistants and Nurse Practitioners) who all work together to provide you with the care you need, when you need it.  Your next appointment:   6 month(s)  The format for your next appointment:   In Person  Provider:   Shelva Majestic, MD   Other Instructions None

## 2020-08-27 ENCOUNTER — Other Ambulatory Visit: Payer: Self-pay | Admitting: Cardiovascular Disease

## 2020-11-11 ENCOUNTER — Other Ambulatory Visit: Payer: Self-pay | Admitting: Cardiovascular Disease

## 2020-11-24 ENCOUNTER — Other Ambulatory Visit: Payer: Self-pay | Admitting: Cardiovascular Disease

## 2020-11-24 DIAGNOSIS — I10 Essential (primary) hypertension: Secondary | ICD-10-CM

## 2020-11-25 NOTE — Telephone Encounter (Signed)
Pt needs to schedule appt for May.

## 2021-02-16 ENCOUNTER — Other Ambulatory Visit: Payer: Self-pay | Admitting: Cardiovascular Disease

## 2021-02-16 DIAGNOSIS — I10 Essential (primary) hypertension: Secondary | ICD-10-CM

## 2021-03-26 ENCOUNTER — Telehealth: Payer: Self-pay

## 2021-03-26 ENCOUNTER — Encounter: Payer: Self-pay | Admitting: Cardiovascular Disease

## 2021-03-26 ENCOUNTER — Other Ambulatory Visit: Payer: Self-pay

## 2021-03-26 ENCOUNTER — Ambulatory Visit: Payer: Medicare Other | Admitting: Cardiovascular Disease

## 2021-03-26 VITALS — BP 126/68 | HR 64 | Ht 66.0 in | Wt 198.2 lb

## 2021-03-26 DIAGNOSIS — I10 Essential (primary) hypertension: Secondary | ICD-10-CM | POA: Diagnosis not present

## 2021-03-26 DIAGNOSIS — M549 Dorsalgia, unspecified: Secondary | ICD-10-CM | POA: Diagnosis not present

## 2021-03-26 DIAGNOSIS — E785 Hyperlipidemia, unspecified: Secondary | ICD-10-CM | POA: Diagnosis not present

## 2021-03-26 DIAGNOSIS — I2510 Atherosclerotic heart disease of native coronary artery without angina pectoris: Secondary | ICD-10-CM | POA: Diagnosis not present

## 2021-03-26 NOTE — Progress Notes (Signed)
Patient ID: Sean Fritz, male   DOB: December 28, 1955, 65 y.o.   MRN: [id number]     HPI: Sean Fritz is a 65 y.o. male with known coronary artery disease who presents to the office today for a 14 month followup cardiology evaluation.   Mr. Senkbeil has known CAD and suffered a non-Q-wave MI and underwent initial PTCA of his mid distal RCA as well as proximal circumflex coronary artery in January 1998. In July 1998 he developed restenosis of his proximal RCA and underwent stenting of his RCA. In March 2011 he underwent stenting of a 90% diagonal stenosis. Additional problems include mixed hyperlipidemia with low HDL levels and high triglycerides consistent with an atherogenic dyslipidemia pattern, hypertension, degenerative joint disease, as well as mild obesity.   On 08/07/2013 he underwent L4-L5 redo laminectomy and has noticed significant resolution in some of his previous low extremity symptomatology. His back pain initially improved which resulted in improved ambulation.  He does have issues with urinary incontinence and there is remote history of prostate CA. He tolerated his back surgery from a cardiac standpoint without compromise.   When I last saw him in May 2018 he was stable from a cardiac standpoint without recurrent anginal symptoms  He had lost weight down to 198 pounds, but has regained some of this weight back.  He sees Dr. Sherrell Puller, for primary care, was part of the [facility].  Laboratory from 12/13/2016: Total cholesterol was 137, triglycerides 179, HDL 41, LDL 60.  CBC was normal.  Potassium 4.7.  BUN 24, creatinine 1.36.  PSA was less than 0.01.   I last saw him in May 2019.  At that time he denied any chest pain or shortness of breath.  Since he is single he typically cooks for himself and at the time he was often eating hamburgers at least 3-4 times per week.  He was continuing to have some difficulty with low back discomfort.  Recent lab work at [facility] on December 22, 2017.  Cholesterol was 154, triglycerides 171, HDL 39, and LDL was 81.  Creatinine was 1.35.   He was seen in the office by Almyra Deforest, [facility] in October 2019.  He was on atorvastatin 80 mg and apparently did not tolerate Zetia.  Amlodipine had been discontinued due to swelling and he was started on HCTZ.  I saw him in August 2020 at which time he denied any any chest pain or shortness of breath.  His swelling improved with discontinuance of amlodipine.  He felt the Zetia contributed to his back discomfort leading to its discontinuance.  He sees Dr. Coletta Memos for primary care.  Laboratory from April 09, 2019 was reviewed.  BUN 21 creatinine 1.23 glucose 89.  Potassium was 4.3.  Total cholesterol is 151, LDL cholesterol 75.  This was not fasting since the patient had lunch 3 hours prior to the laboratory and triglycerides were mildly elevated at 186.  He denies palpitations, presyncope or syncope.    I last saw him in May 2021 and since his prior evaluation he continued to do well.  He continues to have difficulty with his back.  He denied any chest pain or shortness of breath and often was walking 30 to 40 minutes a day.  He was no longer on Zetia since he felt this contributed to some of his back problems.  He denied any edema, PND, orthopnea.  Most recent LDL cholesterol was 75 in April 2021.  Triglycerides  had increased to 175 and I further discussed diet and weight loss.  Since I last saw him, he was evaluated by Fabian Sharp in November 2021 and remained stable.  Presently, he had recently undergone lab work in March 2022 which showed a total cholesterol 127, triglycerides 167, HDL 39, and LDL 63.  Glucose was 100.  B BUN 20 and creatinine 1.4.  LFTs were normal.  He denies any chest pain or shortness of breath.  He has lost weight from a peak of 223 to 198.  He presents for reevaluation.   Past Medical History:  Diagnosis Date   Cancer [facility])    Prostate    Constipation due to pain medication     Coronary artery disease    Degenerative joint disease    Depression    Hypertension    Inability to control urination    d/t prostate surgery; pt wears pad   Mixed hyperlipidemia    Myocardial infarction ([place]) 1998    stress Lexiscan Normal; Low risk scan. EF is 58%. Echo 12/07/09/ normal EF > 55%    Rosacea    on nose    Past Surgical History:  Procedure Laterality Date   BACK SURGERY  Nov 2009   CORONARY ANGIOPLASTY WITH STENT PLACEMENT  1998 & 12/01/09   2.25 x64m Taxus Atom Liberte DES stent-diagnal vessel with cutting balloon arthrotomy.   ELBOW SURGERY Left July 2010   PROSTATE SURGERY     TONSILLECTOMY      Allergies  Allergen Reactions   Duloxetine Hcl Other (See Comments)    "pass out" Other reaction(s): Other "pass out"   Duloxetine     Other reaction(s): Other (See Comments) "pass out" "pass out"    No Known Drug Allergy     Current Outpatient Medications  Medication Sig Dispense Refill   amLODipine (NORVASC) 10 MG tablet TAKE 1 TABLET BY MOUTH  DAILY 30 tablet 0   aspirin 81 MG tablet Take 81 mg by mouth daily.      atorvastatin (LIPITOR) 80 MG tablet TAKE 1 TABLET BY MOUTH  DAILY 90 tablet 3   buPROPion (WELLBUTRIN XL) 150 MG 24 hr tablet Take 150 mg by mouth 3 (three) times daily.     clopidogrel (PLAVIX) 75 MG tablet TAKE 1 TABLET BY MOUTH  DAILY 90 tablet 3   diphenhydramine-acetaminophen (TYLENOL PM) 25-500 MG TABS Take 2 tablets by mouth at bedtime.     Docusate Sodium (DSS) 100 MG CAPS Take 100 mg by mouth daily as needed.     EPIPEN 2-PAK 0.3 MG/0.3ML SOAJ injection      FLUoxetine (PROZAC) 20 MG capsule Take 20 mg by mouth daily.     HYDROcodone-acetaminophen (NORCO) 10-325 MG per tablet Take 0.5-1 tablets by mouth 4 (four) times daily.      isosorbide mononitrate (IMDUR) 60 MG 24 hr tablet TAKE 1 TABLET BY MOUTH  DAILY 90 tablet 3   metoprolol succinate (TOPROL-XL) 50 MG 24 hr tablet TAKE 2 TABLETS BY MOUTH  DAILY. TAKE WITH OR  IMMEDIATELY  FOLLOWING A  MEAL. 180 tablet 3   nitroGLYCERIN (NITROSTAT) 0.4 MG SL tablet Place 1 tablet (0.4 mg total) under the tongue as needed. 30 tablet 2   PROCTOSOL HC 2.5 % rectal cream Apply 1 application topically as needed.     ramipril (ALTACE) 5 MG capsule TAKE 1 CAPSULE BY MOUTH  DAILY 90 capsule 3   tiZANidine (ZANAFLEX) 4 MG tablet Take 1 tablet by mouth as  needed.  0   topiramate (TOPAMAX) 25 MG tablet      No current facility-administered medications for this visit.    Socially he is divorced. There is a remote tobacco history. He does try to exercise but he has been limited by his mild residual back discomfort .  ROS General: Negative; No fevers, chills, or night sweats;  HEENT: Negative; No changes in vision or hearing, sinus congestion, difficulty swallowing Pulmonary: Negative; No cough, wheezing, shortness of breath, hemoptysis Cardiovascular: Negative; No chest pain, presyncope, syncope, palpatations GI: Negative; No nausea, vomiting, diarrhea, or abdominal pain GU: History of robotic prostate removal secondary to prostate CA with residual urinary incontinence; difficulty with erectile function; No dysuria, hematuria, or difficulty voiding Musculoskeletal: Positive for lumbar disc disease, status post L4-L5 spinal fusion; positive for hip discomfort  Hematologic/Oncology: Negative; no easy bruising, bleeding Endocrine: Probable metabolic syndrome; no heat/cold intolerance; no diabetes Neuro: Negative; no changes in balance, headaches Skin: Negative; No rashes or skin lesions Psychiatric: Negative; No behavioral problems, depression Sleep: Negative; No snoring, daytime sleepiness, hypersomnolence, bruxism, restless legs, hypnogognic hallucinations, no cataplexy Other comprehensive 14 point system review is negative.   PE BP 126/68   Pulse 64   Ht '5\' 6"'  (1.676 m)   Wt 198 lb 3.2 oz (89.9 kg)   SpO2 99%   BMI 31.99 kg/m    Repeat blood pressure by me 122/70  Wt  Readings from Last 3 Encounters:  03/26/21 198 lb 3.2 oz (89.9 kg)  08/03/20 210 lb (95.3 kg)  01/29/20 212 lb 12.8 oz (96.5 kg)    General: Alert, oriented, no distress.  Skin: normal turgor, no rashes, warm and dry HEENT: Normocephalic, atraumatic. Pupils equal round and reactive to light; sclera anicteric; extraocular muscles intact;  Nose without nasal septal hypertrophy Mouth/Parynx benign; Mallinpatti scale 3 Neck: No JVD, no carotid bruits; normal carotid upstroke Lungs: clear to ausculatation and percussion; no wheezing or rales Chest wall: without tenderness to palpitation Heart: PMI not displaced, RRR, s1 s2 normal, 1/6 systolic murmur, no diastolic murmur, no rubs, gallops, thrills, or heaves Abdomen: soft, nontender; no hepatosplenomehaly, BS+; abdominal aorta nontender and not dilated by palpation. Back: no CVA tenderness Pulses 2+ Musculoskeletal: full range of motion, normal strength, no joint deformities Extremities: no clubbing cyanosis or edema, Homan's sign negative  Neurologic: grossly nonfocal; Cranial nerves grossly wnl Psychologic: Normal mood and affect  ECG (independently read by me):  NSR at 64; no ectopy  May 2021 ECG (independently read by me): NSR at 62; normal intervals  August 2020 ECG (independently read by me): Normal sinus rhythm at 60 with PAC.  No ST segment changes.  Normal intervals  May 2019 ECG (independently read by me): Sinus rhythm at 62 bpm.  No ectopy.  No ST segment changes.  May 2018 ECG (independently read by me): Normal sinus rhythm at 68 bpm.  Normal intervals.  No significant ST changes.  April 2017 ECG (independently read by me): Normal sinus rhythm at 63 bpm.  No ectopy.  Normal intervals.  Small Q wave in lead 3.  July 2016 ECG (independently read by me):  Normal sinus rhythm at 70 bpm..  No ectopy.  No significant ST changes.  December 2015ECG (independently read by me): normal sinus rhythm at 62 bpm.  PR interval 180 ms.   QTc interval 420 ms.  June 2015 ECG (independently read by me): Normal sinus rhythm at 69 beats per minute.  No significant ST-T change  ECG: Sinus  rhythm at 70 beats per minute. No ectopy. Intervals normal.  LABS: I have personally reviewed the laboratory results done by Dr. Coletta Memos from 12/13/2016 as outlined above.  I reviewed laboratory from [facility] from December 22, 2017.  I personally reviewed laboratory from April 10, 2019   BMP Latest Ref Rng & Units 05/21/2018 04/27/2015 03/27/2015  Glucose 65 - 99 mg/dL 98 111(H) 103(H)  BUN 8 - 27 mg/dL 23 23 25(H)  Creatinine 0.76 - 1.27 mg/dL 1.42(H) 1.19 1.41(H)  BUN/Creat Ratio 10 - 24 16 - -  Sodium 134 - 144 mmol/L 140 141 141  Potassium 3.5 - 5.2 mmol/L 4.4 4.1 4.2  Chloride 96 - 106 mmol/L 104 108 107  CO2 20 - 29 mmol/L '20 22 23  ' Calcium 8.6 - 10.2 mg/dL 9.5 9.3 8.7   Hepatic Function Latest Ref Rng & Units 01/15/2020 05/21/2018 03/27/2015  Total Protein 6.0 - 8.5 g/dL 7.0 6.8 6.2  Albumin 3.8 - 4.8 g/dL 4.9(H) 4.9(H) 4.3  AST 0 - 40 IU/L '18 17 14  ' ALT 0 - 44 IU/L '19 23 22  ' Alk Phosphatase 39 - 117 IU/L 119(H) 123(H) 90  Total Bilirubin 0.0 - 1.2 mg/dL 0.5 0.4 0.5  Bilirubin, Direct 0.00 - 0.40 mg/dL 0.13 - -   CBC Latest Ref Rng & Units 03/27/2015 02/24/2014 08/10/2013  WBC 4.0 - 10.5 K/uL 7.3 7.7 12.6(H)  Hemoglobin 13.0 - 17.0 g/dL 13.8 13.3 10.5(L)  Hematocrit 39.0 - 52.0 % 41.7 39.3 29.5(L)  Platelets 150 - 400 K/uL 206 205 179   Lab Results  Component Value Date   MCV 88.9 03/27/2015   MCV 86.9 02/24/2014   MCV 87.8 08/10/2013   Lab Results  Component Value Date   TSH 1.710 03/27/2015   Lipid Panel     Component Value Date/Time   CHOL 145 01/15/2020 1513   TRIG 175 (H) 01/15/2020 1513   HDL 40 01/15/2020 1513   CHOLHDL 3.6 01/15/2020 1513   CHOLHDL 3.9 03/27/2015 1118   VLDL 37 03/27/2015 1118   LDLCALC 75 01/15/2020 1513    IMPRESSION:  1. Essential hypertension   2. CAD in native  artery   3. Hyperlipidemia with target LDL less than 70   4. Discomfort of back     ASSESSMENT AND PLAN: Mr.Chavarin is a 65 year old white male who has CAD and is s/p remote intervention to his right coronary artery, circumflex vessel, as well as diagonal vessels.  He continues to be stable without recurrent anginal symptoms.  He had developed leg edema leading to previous discontinuance of amlodipine but he has remained angina free.  Blood pressure today remained stable on ramipril 5 mg, metoprolol succinate 100 mg daily, in addition to his isosorbide mononitrate.  He continues to be on DAPT with aspirin/Plavix.  His med she however still indicates he is taking amlodipine 10 mg.  This again will need to be verified.  He continues to have some back discomfort.  I commended him on his weight loss of approximately 25 pounds.  Most recent LDL cholesterol is excellent and he continues to be on atorvastatin 80 mg daily without Zetia.  I have recommended at the beginning of 2023 he undergo repeat fasting laboratory and I will see him in the office in follow-up and further recommendations will be made at that time.   Troy Sine, MD, [facility]  03/29/2021 9:36 AM

## 2021-03-26 NOTE — Patient Instructions (Signed)
Medication Instructions:  Your physician recommends that you continue on your current medications as directed. Please refer to the Current Medication list given to you today.  *If you need a refill on your cardiac medications before your next appointment, please call your pharmacy*   Lab Work: CBC, CMET, TSH, Lipid. FASTING.  If you have labs (blood work) drawn today and your tests are completely normal, you will receive your results only by: [place] (if you have MyChart) OR A paper copy in the mail If you have any lab test that is abnormal or we need to change your treatment, we will call you to review the results.   Testing/Procedures: None ordered.    Follow-Up: At [facility], you and your health needs are our priority.  As part of our continuing mission to provide you with exceptional heart care, we have created designated Provider Care Teams.  These Care Teams include your primary Cardiologist (physician) and Advanced Practice Providers (APPs -  Physician Assistants and Nurse Practitioners) who all work together to provide you with the care you need, when you need it.  We recommend signing up for the patient portal called "MyChart".  Sign up information is provided on this After Visit Summary.  MyChart is used to connect with patients for Virtual Visits (Telemedicine).  Patients are able to view lab/test results, encounter notes, upcoming appointments, etc.  Non-urgent messages can be sent to your provider as well.   To learn more about what you can do with MyChart, go to [email or web address].    Your next appointment:   6 month(s)  The format for your next appointment:   In Person  Provider:   Shelva Majestic, MD

## 2021-03-26 NOTE — Telephone Encounter (Addendum)
Attempted to call patient to review AVS as patient left prior to receiving AVS. Call went to voicemail, nonspecific message left for patient to return call from office. Pt does have MyChart access. This RN to mail AVS to patient.

## 2021-03-29 ENCOUNTER — Encounter: Payer: Self-pay | Admitting: Cardiovascular Disease

## 2021-03-30 ENCOUNTER — Telehealth: Payer: Self-pay | Admitting: Cardiovascular Disease

## 2021-03-30 ENCOUNTER — Other Ambulatory Visit: Payer: Self-pay

## 2021-03-30 MED ORDER — NITROGLYCERIN 0.4 MG SL SUBL: 0.4000 mg | SUBLINGUAL_TABLET | SUBLINGUAL | 2 refills | Status: AC | PRN

## 2021-03-30 NOTE — Telephone Encounter (Signed)
Called patient, advised of message from RN in regards to the AVS.  Went over AVS with patient. Advised that it was mailed to him.  He requested Nitro refill be sent to pharmacy for updated.  I sent in RX.  Patient verbalized understanding.

## 2021-03-30 NOTE — Telephone Encounter (Signed)
Follow Up:     Pt was returning a call from Friday. He said if he is not there, please just leave a detailed message.

## 2021-04-29 ENCOUNTER — Other Ambulatory Visit: Payer: Self-pay | Admitting: Cardiovascular Disease

## 2021-07-30 ENCOUNTER — Other Ambulatory Visit: Payer: Self-pay | Admitting: Cardiovascular Disease

## 2021-08-04 ENCOUNTER — Other Ambulatory Visit: Payer: Self-pay | Admitting: Cardiovascular Disease

## 2021-08-04 DIAGNOSIS — I10 Essential (primary) hypertension: Secondary | ICD-10-CM

## 2021-10-17 ENCOUNTER — Other Ambulatory Visit: Payer: Self-pay | Admitting: Cardiovascular Disease

## 2022-01-10 ENCOUNTER — Other Ambulatory Visit: Payer: Self-pay | Admitting: Cardiovascular Disease

## 2022-03-12 ENCOUNTER — Other Ambulatory Visit: Payer: Self-pay | Admitting: Cardiovascular Disease

## 2022-04-01 ENCOUNTER — Other Ambulatory Visit: Payer: Self-pay | Admitting: Cardiovascular Disease

## 2022-06-23 ENCOUNTER — Other Ambulatory Visit: Payer: Self-pay | Admitting: Cardiovascular Disease

## 2022-08-25 ENCOUNTER — Other Ambulatory Visit: Payer: Self-pay | Admitting: Cardiovascular Disease

## 2022-09-15 ENCOUNTER — Other Ambulatory Visit: Payer: Self-pay | Admitting: Cardiovascular Disease

## 2023-02-10 ENCOUNTER — Other Ambulatory Visit: Payer: Self-pay | Admitting: Cardiovascular Disease

## 2023-02-13 NOTE — Telephone Encounter (Signed)
Patient requesting refills on Atorvastatin. Last seen 03/26/21. Please Advise.

## 2023-02-20 ENCOUNTER — Other Ambulatory Visit: Payer: Self-pay | Admitting: Cardiovascular Disease

## 2023-04-18 ENCOUNTER — Other Ambulatory Visit: Payer: Self-pay | Admitting: Cardiovascular Disease

## 2023-04-27 ENCOUNTER — Other Ambulatory Visit: Payer: Self-pay | Admitting: Cardiovascular Disease

## 2023-05-07 ENCOUNTER — Other Ambulatory Visit: Payer: Self-pay | Admitting: Cardiovascular Disease

## 2023-05-11 ENCOUNTER — Other Ambulatory Visit: Payer: Self-pay | Admitting: Cardiovascular Disease

## 2023-05-18 ENCOUNTER — Other Ambulatory Visit: Payer: Self-pay | Admitting: Cardiovascular Disease

## 2023-05-26 ENCOUNTER — Other Ambulatory Visit: Payer: Self-pay | Admitting: Cardiovascular Disease

## 2023-05-31 ENCOUNTER — Other Ambulatory Visit: Payer: Self-pay | Admitting: Cardiovascular Disease

## 2024-02-19 ENCOUNTER — Other Ambulatory Visit: Payer: Self-pay | Admitting: Cardiovascular Disease

## 2024-03-09 ENCOUNTER — Other Ambulatory Visit: Payer: Self-pay | Admitting: Cardiovascular Disease

## 2024-03-28 ENCOUNTER — Other Ambulatory Visit: Payer: Self-pay | Admitting: Cardiovascular Disease

## 2024-04-03 LAB — COLOGUARD: COLOGUARD: NEGATIVE

## 2024-04-04 ENCOUNTER — Other Ambulatory Visit: Payer: Self-pay | Admitting: Cardiovascular Disease

## 2024-08-26 ENCOUNTER — Other Ambulatory Visit: Payer: Self-pay | Admitting: General Practice

## 2024-08-29 ENCOUNTER — Other Ambulatory Visit: Payer: Self-pay | Admitting: General Practice
